# Patient Record
Sex: Male | Born: 1965 | Race: White | Hispanic: No | Marital: Married | Smoking: Current every day smoker
Health system: Southern US, Community
[De-identification: ages and names within clinical notes are randomized; demographics above are authoritative.]

## PROBLEM LIST (undated history)

## (undated) DIAGNOSIS — K219 Gastro-esophageal reflux disease without esophagitis: Secondary | ICD-10-CM

## (undated) DIAGNOSIS — IMO0002 Reserved for concepts with insufficient information to code with codable children: Secondary | ICD-10-CM

## (undated) DIAGNOSIS — R519 Headache, unspecified: Secondary | ICD-10-CM

## (undated) DIAGNOSIS — M539 Dorsopathy, unspecified: Secondary | ICD-10-CM

## (undated) DIAGNOSIS — R51 Headache: Secondary | ICD-10-CM

## (undated) DIAGNOSIS — M1990 Unspecified osteoarthritis, unspecified site: Secondary | ICD-10-CM

## (undated) DIAGNOSIS — R001 Bradycardia, unspecified: Secondary | ICD-10-CM

## (undated) DIAGNOSIS — F32A Depression, unspecified: Secondary | ICD-10-CM

## (undated) DIAGNOSIS — I639 Cerebral infarction, unspecified: Secondary | ICD-10-CM

## (undated) DIAGNOSIS — F329 Major depressive disorder, single episode, unspecified: Secondary | ICD-10-CM

## (undated) HISTORY — PX: SHOULDER SURGERY: SHX246

## (undated) HISTORY — DX: Unspecified osteoarthritis, unspecified site: M19.90

## (undated) HISTORY — DX: Bradycardia, unspecified: R00.1

## (undated) HISTORY — DX: Reserved for concepts with insufficient information to code with codable children: IMO0002

## (undated) HISTORY — DX: Dorsopathy, unspecified: M53.9

---

## 1999-06-30 ENCOUNTER — Encounter: Payer: Self-pay | Admitting: Emergency Medicine

## 1999-06-30 ENCOUNTER — Emergency Department (HOSPITAL_COMMUNITY): Admission: EM | Admit: 1999-06-30 | Discharge: 1999-06-30 | Payer: Self-pay | Admitting: Emergency Medicine

## 2001-07-06 ENCOUNTER — Ambulatory Visit (HOSPITAL_COMMUNITY): Admission: RE | Admit: 2001-07-06 | Discharge: 2001-07-06 | Payer: Self-pay | Admitting: Family Medicine

## 2001-07-06 ENCOUNTER — Encounter: Payer: Self-pay | Admitting: Family Medicine

## 2001-07-08 ENCOUNTER — Ambulatory Visit (HOSPITAL_COMMUNITY): Admission: RE | Admit: 2001-07-08 | Discharge: 2001-07-08 | Payer: Self-pay | Admitting: Family Medicine

## 2001-07-08 ENCOUNTER — Encounter: Payer: Self-pay | Admitting: Family Medicine

## 2001-11-10 ENCOUNTER — Emergency Department (HOSPITAL_COMMUNITY): Admission: EM | Admit: 2001-11-10 | Discharge: 2001-11-10 | Payer: Self-pay | Admitting: Emergency Medicine

## 2001-11-10 ENCOUNTER — Encounter: Payer: Self-pay | Admitting: Emergency Medicine

## 2001-11-15 ENCOUNTER — Encounter: Payer: Self-pay | Admitting: Emergency Medicine

## 2001-11-15 ENCOUNTER — Emergency Department (HOSPITAL_COMMUNITY): Admission: EM | Admit: 2001-11-15 | Discharge: 2001-11-15 | Payer: Self-pay | Admitting: Emergency Medicine

## 2005-04-15 ENCOUNTER — Encounter: Admission: RE | Admit: 2005-04-15 | Discharge: 2005-04-15 | Payer: Self-pay | Admitting: Family Medicine

## 2008-01-28 ENCOUNTER — Emergency Department (HOSPITAL_BASED_OUTPATIENT_CLINIC_OR_DEPARTMENT_OTHER): Admission: EM | Admit: 2008-01-28 | Discharge: 2008-01-28 | Payer: Self-pay | Admitting: Emergency Medicine

## 2008-02-18 ENCOUNTER — Encounter: Admission: RE | Admit: 2008-02-18 | Discharge: 2008-02-18 | Payer: Self-pay | Admitting: Orthopedic Surgery

## 2008-02-25 ENCOUNTER — Encounter: Admission: RE | Admit: 2008-02-25 | Discharge: 2008-02-25 | Payer: Self-pay | Admitting: Orthopedic Surgery

## 2010-06-23 ENCOUNTER — Encounter: Payer: Self-pay | Admitting: Orthopedic Surgery

## 2010-07-26 ENCOUNTER — Ambulatory Visit (INDEPENDENT_AMBULATORY_CARE_PROVIDER_SITE_OTHER): Payer: BC Managed Care – PPO | Admitting: Emergency Medicine

## 2010-07-26 ENCOUNTER — Encounter: Payer: Self-pay | Admitting: Emergency Medicine

## 2010-07-26 DIAGNOSIS — S139XXA Sprain of joints and ligaments of unspecified parts of neck, initial encounter: Secondary | ICD-10-CM | POA: Insufficient documentation

## 2010-07-26 DIAGNOSIS — M542 Cervicalgia: Secondary | ICD-10-CM

## 2010-07-28 ENCOUNTER — Telehealth (INDEPENDENT_AMBULATORY_CARE_PROVIDER_SITE_OTHER): Payer: Self-pay | Admitting: *Deleted

## 2010-07-29 NOTE — Assessment & Plan Note (Signed)
Summary: PROBLEMS WITH SHOULDER AND NECK/WSE(rm2)   Vital Signs:  Patient Profile:   45 Years Old Male CC:      shoulder/nek pain Height:     70 inches Weight:      170.5 pounds O2 Sat:      99 % O2 treatment:    Room Air Temp:     98.5 degrees F oral Pulse rate:   57 / minute Resp:     20 per minute BP sitting:   117 / 71  (left arm) Cuff size:   regular  Vitals Entered By: Burnard Hawthorne RN (July 26, 2010 10:45 AM)                  Updated Prior Medication List: No Medications Current Allergies: No known allergies History of Present Illness Chief Complaint: shoulder/nek pain History of Present Illness: Was lifting and installing a double door about 3 weeks ago.  No trauma or falling.  Later that night and the next day was very sore.  Hasn't used any meds or heat.  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  Pain located L side of neck radiating into neck and shoulder.  No weakness, numbness.  Pain is cramping and fairly constant, worse with movement.  REVIEW OF SYSTEMS Constitutional Symptoms      Denies fever, chills, night sweats, weight loss, weight gain, and fatigue.  Eyes       Denies change in vision, eye pain, eye discharge, glasses, contact lenses, and eye surgery. Ear/Nose/Throat/Mouth       Denies hearing loss/aids, change in hearing, ear pain, ear discharge, dizziness, frequent runny nose, frequent nose bleeds, sinus problems, sore throat, hoarseness, and tooth pain or bleeding.  Respiratory       Denies dry cough, productive cough, wheezing, shortness of breath, asthma, bronchitis, and emphysema/COPD.  Cardiovascular       Denies murmurs, chest pain, and tires easily with exhertion.    Gastrointestinal       Denies stomach pain, nausea/vomiting, diarrhea, constipation, blood in bowel movements, and indigestion. Genitourniary       Denies painful urination, kidney stones, and loss of urinary  control. Neurological       Denies paralysis, seizures, and fainting/blackouts. Musculoskeletal       Complains of muscle pain, joint pain, and decreased range of motion.      Denies joint stiffness, redness, swelling, muscle weakness, and gout.  Skin       Denies bruising, unusual mles/lumps or sores, and hair/skin or nail changes.  Psych       Denies mood changes, temper/anger issues, anxiety/stress, speech problems, depression, and sleep problems. Other Comments: lifting a door x2weeks ago have been having pain in neck/shoulder and moving dowm left arm.   Past History:  Past Medical History: Unremarkable  Past Surgical History: Rotator cuff repair  Family History: leukemia  Social History: Reviewed history and no changes required. smoker- pack/day drinks alcohol no street drugs Physical Exam General appearance: well developed, well nourished, mild distress and holding neck slightly cocked the side. Head: normocephalic, atraumatic MSE: oriented to time, place, and person Neck ROM is full but slow due to pain.  +TTP and spasm L paraspinal.  No central bony tenderness.  Spurling test is negative. Shoulder FROM but mild tenderness acromion and bicep tendon.  Hawking and Neers neg.  No swelling or bruising. Rest of his back exam and neck exam are  normal Assessment New Problems: NECK SPASM (ICD-847.0) NECK PAIN (ICD-723.1)   Plan New Medications/Changes: ULTRACET 37.5-325 MG TABS (TRAMADOL-ACETAMINOPHEN) 1 by mouth q8 hrs as needed pain  #20 x 0, 07/26/2010, Hoyt Koch MD FLEXERIL 10 MG TABS (CYCLOBENZAPRINE HCL) 1 by mouth up to three times a day as needed for spasms  #21 x 0, 07/26/2010, Hoyt Koch MD PREDNISONE 10 MG TABS (PREDNISONE) 20mg  two times a day for 5 days  #QS x 0, 07/26/2010, Hoyt Koch MD  New Orders: New Patient Level III [phone] Planning Comments:   Heating pad Take prednisone first, then switch to ultracet.  Flexeril caution with  sedation.  Will call patient in a few days.  If not improving, send to sports med and/or PT.  At that time, consider cervical Xray   The patient and/or caregiver has been counseled thoroughly with regard to medications prescribed including dosage, schedule, interactions, rationale for use, and possible side effects and they verbalize understanding.  Diagnoses and expected course of recovery discussed and will return if not improved as expected or if the condition worsens. Patient and/or caregiver verbalized understanding.  Prescriptions: ULTRACET 37.5-325 MG TABS (TRAMADOL-ACETAMINOPHEN) 1 by mouth q8 hrs as needed pain  #20 x 0   Entered and Authorized by:   Hoyt Koch MD   Signed by:   Hoyt Koch MD on 07/26/2010   Method used:   Print then Give to Patient   RxID:   [id number] FLEXERIL 10 MG TABS (CYCLOBENZAPRINE HCL) 1 by mouth up to three times a day as needed for spasms  #21 x 0   Entered and Authorized by:   Hoyt Koch MD   Signed by:   Hoyt Koch MD on 07/26/2010   Method used:   Print then Give to Patient   RxID:   [id number] PREDNISONE 10 MG TABS (PREDNISONE) 20mg  two times a day for 5 days  #QS x 0   Entered and Authorized by:   Hoyt Koch MD   Signed by:   Hoyt Koch MD on 07/26/2010   Method used:   Print then Give to Patient   RxID:   [id number]   Orders Added: 1)  New Patient Level III [78588]

## 2010-08-07 NOTE — Progress Notes (Signed)
  Phone Note Outgoing Call Call back at [facility] Phone [phone]   Call placed by: Lajean Saver RN,  July 28, 2010 2:19 PM Call placed to: Patient Summary of Call: Callback: No answer. Message left with reason for call. Call back if no better and will refer to sports med and/or PT

## 2011-01-12 DIAGNOSIS — M549 Dorsalgia, unspecified: Secondary | ICD-10-CM | POA: Insufficient documentation

## 2011-10-07 ENCOUNTER — Encounter: Payer: Self-pay | Admitting: *Deleted

## 2011-10-07 DIAGNOSIS — M539 Dorsopathy, unspecified: Secondary | ICD-10-CM | POA: Insufficient documentation

## 2011-10-07 DIAGNOSIS — R079 Chest pain, unspecified: Secondary | ICD-10-CM | POA: Insufficient documentation

## 2011-10-07 DIAGNOSIS — R001 Bradycardia, unspecified: Secondary | ICD-10-CM | POA: Insufficient documentation

## 2011-10-07 DIAGNOSIS — IMO0002 Reserved for concepts with insufficient information to code with codable children: Secondary | ICD-10-CM | POA: Insufficient documentation

## 2013-09-28 DIAGNOSIS — M5137 Other intervertebral disc degeneration, lumbosacral region: Secondary | ICD-10-CM | POA: Insufficient documentation

## 2013-09-28 DIAGNOSIS — Z720 Tobacco use: Secondary | ICD-10-CM | POA: Insufficient documentation

## 2013-09-28 DIAGNOSIS — M5410 Radiculopathy, site unspecified: Secondary | ICD-10-CM | POA: Insufficient documentation

## 2013-09-28 DIAGNOSIS — D179 Benign lipomatous neoplasm, unspecified: Secondary | ICD-10-CM | POA: Insufficient documentation

## 2013-09-28 DIAGNOSIS — M51379 Other intervertebral disc degeneration, lumbosacral region without mention of lumbar back pain or lower extremity pain: Secondary | ICD-10-CM | POA: Insufficient documentation

## 2013-09-28 DIAGNOSIS — R001 Bradycardia, unspecified: Secondary | ICD-10-CM | POA: Insufficient documentation

## 2013-09-28 DIAGNOSIS — Z23 Encounter for immunization: Secondary | ICD-10-CM | POA: Insufficient documentation

## 2013-09-28 DIAGNOSIS — D171 Benign lipomatous neoplasm of skin and subcutaneous tissue of trunk: Secondary | ICD-10-CM | POA: Insufficient documentation

## 2014-03-01 DIAGNOSIS — I639 Cerebral infarction, unspecified: Secondary | ICD-10-CM

## 2014-03-01 HISTORY — DX: Cerebral infarction, unspecified: I63.9

## 2014-03-13 DIAGNOSIS — I6782 Cerebral ischemia: Secondary | ICD-10-CM | POA: Insufficient documentation

## 2014-03-13 DIAGNOSIS — G939 Disorder of brain, unspecified: Secondary | ICD-10-CM | POA: Insufficient documentation

## 2014-03-15 DIAGNOSIS — Z9889 Other specified postprocedural states: Secondary | ICD-10-CM | POA: Insufficient documentation

## 2014-03-18 DIAGNOSIS — I6529 Occlusion and stenosis of unspecified carotid artery: Secondary | ICD-10-CM | POA: Insufficient documentation

## 2014-03-19 DIAGNOSIS — R9431 Abnormal electrocardiogram [ECG] [EKG]: Secondary | ICD-10-CM | POA: Insufficient documentation

## 2014-03-19 DIAGNOSIS — R001 Bradycardia, unspecified: Secondary | ICD-10-CM | POA: Insufficient documentation

## 2014-04-10 DIAGNOSIS — Z8673 Personal history of transient ischemic attack (TIA), and cerebral infarction without residual deficits: Secondary | ICD-10-CM | POA: Insufficient documentation

## 2014-05-08 DIAGNOSIS — R519 Headache, unspecified: Secondary | ICD-10-CM | POA: Insufficient documentation

## 2014-05-08 DIAGNOSIS — R51 Headache: Secondary | ICD-10-CM

## 2014-05-08 DIAGNOSIS — E785 Hyperlipidemia, unspecified: Secondary | ICD-10-CM | POA: Insufficient documentation

## 2014-07-11 ENCOUNTER — Other Ambulatory Visit (HOSPITAL_COMMUNITY): Payer: Self-pay | Admitting: Neurosurgery

## 2014-07-18 ENCOUNTER — Other Ambulatory Visit (HOSPITAL_COMMUNITY): Payer: Self-pay

## 2014-07-18 NOTE — Pre-Procedure Instructions (Signed)
Cody Knight  07/18/2014   Your procedure is scheduled on:  08/02/14  Report to [facility] Admitting at 530 AM.  Call this number if you have problems the morning of surgery: [phone]   Remember:   Do not eat food or drink liquids after midnight.   Take these medicines the morning of surgery with A SIP OF WATER: neurontin,,oxycodone,protonix,paxil   Do not wear jewelry, make-up or nail polish.  Do not wear lotions, powders, or perfumes. You may wear deodorant.  Do not shave 48 hours prior to surgery. Men may shave face and neck.  Do not bring valuables to the hospital.  [facility] is not responsible                  for any belongings or valuables.               Contacts, dentures or bridgework may not be worn into surgery.  Leave suitcase in the car. After surgery it may be brought to your room.  For patients admitted to the hospital, discharge time is determined by your                treatment team.               Patients discharged the day of surgery will not be allowed to drive  home.  Name and phone number of your driver: family  Special Instructions: Shower using CHG 2 nights before surgery and the night before surgery.  If you shower the day of surgery use CHG.  Use special wash - you have one bottle of CHG for all showers.  You should use approximately 1/3 of the bottle for each shower.   Please read over the following fact sheets that you were given: Pain Booklet, Coughing and Deep Breathing, MRSA Information and Surgical Site Infection Prevention

## 2014-07-19 ENCOUNTER — Encounter (HOSPITAL_COMMUNITY)
Admission: RE | Admit: 2014-07-19 | Discharge: 2014-07-19 | Disposition: A | Discharge status: Home / Self Care | Payer: BLUE CROSS/BLUE SHIELD | Source: Ambulatory Visit | Attending: Neurosurgery | Admitting: Neurosurgery

## 2014-07-19 ENCOUNTER — Encounter (HOSPITAL_COMMUNITY): Payer: Self-pay

## 2014-07-19 DIAGNOSIS — M4722 Other spondylosis with radiculopathy, cervical region: Secondary | ICD-10-CM | POA: Diagnosis not present

## 2014-07-19 DIAGNOSIS — Z01810 Encounter for preprocedural cardiovascular examination: Secondary | ICD-10-CM | POA: Diagnosis not present

## 2014-07-19 DIAGNOSIS — Z01812 Encounter for preprocedural laboratory examination: Secondary | ICD-10-CM | POA: Diagnosis present

## 2014-07-19 HISTORY — DX: Headache: R51

## 2014-07-19 HISTORY — DX: Major depressive disorder, single episode, unspecified: F32.9

## 2014-07-19 HISTORY — DX: Depression, unspecified: F32.A

## 2014-07-19 HISTORY — DX: Headache, unspecified: R51.9

## 2014-07-19 HISTORY — DX: Cerebral infarction, unspecified: I63.9

## 2014-07-19 LAB — CBC
HCT: 46.8 % (ref 39.0–52.0)
Hemoglobin: 16 g/dL (ref 13.0–17.0)
MCH: 31.7 pg (ref 26.0–34.0)
MCHC: 34.2 g/dL (ref 30.0–36.0)
MCV: 92.7 fL (ref 78.0–100.0)
Platelets: 236 10*3/uL (ref 150–400)
RBC: 5.05 MIL/uL (ref 4.22–5.81)
RDW: 13.4 % (ref 11.5–15.5)
WBC: 8.3 10*3/uL (ref 4.0–10.5)

## 2014-07-19 LAB — BASIC METABOLIC PANEL
Anion gap: 7 (ref 5–15)
BUN: 17 mg/dL (ref 6–23)
CO2: 26 mmol/L (ref 19–32)
CREATININE: 0.92 mg/dL (ref 0.50–1.35)
Calcium: 9.4 mg/dL (ref 8.4–10.5)
Chloride: 108 mmol/L (ref 96–112)
GFR calc Af Amer: >90 mL/min (ref >90)
GFR calc non Af Amer: >90 mL/min (ref >90)
Glucose, Bld: 109 mg/dL — ABNORMAL HIGH (ref 70–99)
Potassium: 3.8 mmol/L (ref 3.5–5.1)
Sodium: 141 mmol/L (ref 135–145)

## 2014-07-19 LAB — SURGICAL PCR SCREEN
MRSA, PCR: NEGATIVE
STAPHYLOCOCCUS AUREUS: NEGATIVE

## 2014-08-01 MED ORDER — CEFAZOLIN SODIUM-DEXTROSE 2-3 GM-% IV SOLR
2.0000 g | INTRAVENOUS | Status: DC
  Filled 2014-08-01: qty 50

## 2014-08-02 ENCOUNTER — Encounter (HOSPITAL_COMMUNITY)
Admission: RE | Disposition: A | Discharge status: Home / Self Care | Payer: Self-pay | Source: Ambulatory Visit | Attending: Neurosurgery

## 2014-08-02 ENCOUNTER — Ambulatory Visit (HOSPITAL_COMMUNITY): Payer: BLUE CROSS/BLUE SHIELD | Admitting: Anesthesiology

## 2014-08-02 ENCOUNTER — Encounter (HOSPITAL_COMMUNITY): Payer: Self-pay | Admitting: *Deleted

## 2014-08-02 ENCOUNTER — Ambulatory Visit (HOSPITAL_COMMUNITY): Payer: BLUE CROSS/BLUE SHIELD

## 2014-08-02 ENCOUNTER — Ambulatory Visit (HOSPITAL_COMMUNITY)
Admission: RE | Admit: 2014-08-02 | Discharge: 2014-08-03 | Disposition: A | Discharge status: Home / Self Care | Payer: BLUE CROSS/BLUE SHIELD | Source: Ambulatory Visit | Attending: Neurosurgery | Admitting: Neurosurgery

## 2014-08-02 DIAGNOSIS — F329 Major depressive disorder, single episode, unspecified: Secondary | ICD-10-CM | POA: Insufficient documentation

## 2014-08-02 DIAGNOSIS — Z7982 Long term (current) use of aspirin: Secondary | ICD-10-CM | POA: Diagnosis not present

## 2014-08-02 DIAGNOSIS — M4722 Other spondylosis with radiculopathy, cervical region: Secondary | ICD-10-CM | POA: Diagnosis not present

## 2014-08-02 DIAGNOSIS — Z419 Encounter for procedure for purposes other than remedying health state, unspecified: Secondary | ICD-10-CM

## 2014-08-02 DIAGNOSIS — Z79899 Other long term (current) drug therapy: Secondary | ICD-10-CM | POA: Insufficient documentation

## 2014-08-02 DIAGNOSIS — M47812 Spondylosis without myelopathy or radiculopathy, cervical region: Secondary | ICD-10-CM | POA: Insufficient documentation

## 2014-08-02 DIAGNOSIS — F17210 Nicotine dependence, cigarettes, uncomplicated: Secondary | ICD-10-CM | POA: Diagnosis not present

## 2014-08-02 DIAGNOSIS — M79601 Pain in right arm: Secondary | ICD-10-CM | POA: Diagnosis present

## 2014-08-02 DIAGNOSIS — Z8673 Personal history of transient ischemic attack (TIA), and cerebral infarction without residual deficits: Secondary | ICD-10-CM | POA: Insufficient documentation

## 2014-08-02 HISTORY — PX: ANTERIOR CERVICAL DECOMP/DISCECTOMY FUSION: SHX1161

## 2014-08-02 SURGERY — ANTERIOR CERVICAL DECOMPRESSION/DISCECTOMY FUSION 1 LEVEL
Anesthesia: General | Site: Spine Cervical

## 2014-08-02 MED ORDER — SUCCINYLCHOLINE CHLORIDE 20 MG/ML IJ SOLN
INTRAMUSCULAR | Status: AC
  Filled 2014-08-02: qty 1

## 2014-08-02 MED ORDER — ACETAMINOPHEN 325 MG PO TABS: 650.0000 mg | ORAL_TABLET | ORAL | Status: DC | PRN

## 2014-08-02 MED ORDER — PHENOL 1.4 % MT LIQD
1.0000 | OROMUCOSAL | Status: DC | PRN
  Filled 2014-08-02: qty 177

## 2014-08-02 MED ORDER — MENTHOL 3 MG MT LOZG: 1.0000 | LOZENGE | OROMUCOSAL | Status: DC | PRN

## 2014-08-02 MED ORDER — OXYCODONE HCL 5 MG/5ML PO SOLN: 5.0000 mg | Freq: Once | ORAL | Status: AC | PRN

## 2014-08-02 MED ORDER — FENTANYL CITRATE 0.05 MG/ML IJ SOLN
INTRAMUSCULAR | Status: AC
  Filled 2014-08-02: qty 5

## 2014-08-02 MED ORDER — NEOSTIGMINE METHYLSULFATE 10 MG/10ML IV SOLN
INTRAVENOUS | Status: DC | PRN
  Administered 2014-08-02: 3 mg via INTRAVENOUS

## 2014-08-02 MED ORDER — ALUM & MAG HYDROXIDE-SIMETH 200-200-20 MG/5ML PO SUSP: 30.0000 mL | Freq: Four times a day (QID) | ORAL | Status: DC | PRN

## 2014-08-02 MED ORDER — SODIUM CHLORIDE 0.9 % IR SOLN
Status: DC | PRN
  Administered 2014-08-02: 500 mL

## 2014-08-02 MED ORDER — DIAZEPAM 5 MG PO TABS
ORAL_TABLET | ORAL | Status: AC
  Administered 2014-08-02: 5 mg via ORAL
  Filled 2014-08-02: qty 1

## 2014-08-02 MED ORDER — LIDOCAINE HCL (CARDIAC) 20 MG/ML IV SOLN
INTRAVENOUS | Status: DC | PRN
  Administered 2014-08-02: 50 mg via INTRAVENOUS

## 2014-08-02 MED ORDER — HYDROMORPHONE HCL 1 MG/ML IJ SOLN
INTRAMUSCULAR | Status: AC
  Administered 2014-08-02: 0.5 mg via INTRAVENOUS
  Filled 2014-08-02: qty 2

## 2014-08-02 MED ORDER — OXYCODONE HCL 5 MG PO TABS
5.0000 mg | ORAL_TABLET | Freq: Once | ORAL | Status: AC | PRN
  Administered 2014-08-02: 5 mg via ORAL

## 2014-08-02 MED ORDER — GLYCOPYRROLATE 0.2 MG/ML IJ SOLN
INTRAMUSCULAR | Status: AC
  Filled 2014-08-02: qty 2

## 2014-08-02 MED ORDER — NEOSTIGMINE METHYLSULFATE 10 MG/10ML IV SOLN
INTRAVENOUS | Status: AC
  Filled 2014-08-02: qty 1

## 2014-08-02 MED ORDER — OXYCODONE-ACETAMINOPHEN 5-325 MG PO TABS
ORAL_TABLET | ORAL | Status: AC
  Administered 2014-08-02: 2 via ORAL
  Filled 2014-08-02: qty 2

## 2014-08-02 MED ORDER — LACTATED RINGERS IV SOLN
INTRAVENOUS | Status: DC | PRN
  Administered 2014-08-02 (×2): via INTRAVENOUS

## 2014-08-02 MED ORDER — ONDANSETRON HCL 4 MG/2ML IJ SOLN: 4.0000 mg | Freq: Four times a day (QID) | INTRAMUSCULAR | Status: DC | PRN

## 2014-08-02 MED ORDER — GLYCOPYRROLATE 0.2 MG/ML IJ SOLN
INTRAMUSCULAR | Status: DC | PRN
  Administered 2014-08-02: 0.4 mg via INTRAVENOUS

## 2014-08-02 MED ORDER — SODIUM CHLORIDE 0.9 % IJ SOLN
INTRAMUSCULAR | Status: AC
  Filled 2014-08-02: qty 10

## 2014-08-02 MED ORDER — EPHEDRINE SULFATE 50 MG/ML IJ SOLN
INTRAMUSCULAR | Status: AC
  Filled 2014-08-02: qty 1

## 2014-08-02 MED ORDER — OXYCODONE HCL 5 MG PO TABS
ORAL_TABLET | ORAL | Status: AC
  Filled 2014-08-02: qty 1

## 2014-08-02 MED ORDER — ONDANSETRON HCL 4 MG/2ML IJ SOLN
INTRAMUSCULAR | Status: DC | PRN
  Administered 2014-08-02: 4 mg via INTRAVENOUS

## 2014-08-02 MED ORDER — NICOTINE 21 MG/24HR TD PT24
21.0000 mg | MEDICATED_PATCH | Freq: Every day | TRANSDERMAL | Status: DC
  Administered 2014-08-02: 21 mg via TRANSDERMAL
  Filled 2014-08-02 (×2): qty 1

## 2014-08-02 MED ORDER — ROCURONIUM BROMIDE 50 MG/5ML IV SOLN
INTRAVENOUS | Status: AC
  Filled 2014-08-02: qty 1

## 2014-08-02 MED ORDER — MORPHINE SULFATE 2 MG/ML IJ SOLN
1.0000 mg | INTRAMUSCULAR | Status: DC | PRN
Start: 2014-08-02 — End: 2014-08-03
  Administered 2014-08-02 (×2): 4 mg via INTRAVENOUS
  Administered 2014-08-03: 2 mg via INTRAVENOUS
  Filled 2014-08-02: qty 1
  Filled 2014-08-02 (×2): qty 2

## 2014-08-02 MED ORDER — FENTANYL CITRATE 0.05 MG/ML IJ SOLN
INTRAMUSCULAR | Status: DC | PRN
Start: 2014-08-02 — End: 2014-08-02
  Administered 2014-08-02: 50 ug via INTRAVENOUS
  Administered 2014-08-02: 150 ug via INTRAVENOUS
  Administered 2014-08-02 (×3): 100 ug via INTRAVENOUS

## 2014-08-02 MED ORDER — 0.9 % SODIUM CHLORIDE (POUR BTL) OPTIME
TOPICAL | Status: DC | PRN
  Administered 2014-08-02: 1000 mL

## 2014-08-02 MED ORDER — PANTOPRAZOLE SODIUM 40 MG PO TBEC
40.0000 mg | DELAYED_RELEASE_TABLET | Freq: Every day | ORAL | Status: DC
  Administered 2014-08-02: 40 mg via ORAL
  Filled 2014-08-02: qty 1

## 2014-08-02 MED ORDER — ROSUVASTATIN CALCIUM 20 MG PO TABS
20.0000 mg | ORAL_TABLET | Freq: Every day | ORAL | Status: DC
  Administered 2014-08-02: 20 mg via ORAL
  Filled 2014-08-02 (×2): qty 1

## 2014-08-02 MED ORDER — DOCUSATE SODIUM 100 MG PO CAPS
100.0000 mg | ORAL_CAPSULE | Freq: Two times a day (BID) | ORAL | Status: DC
  Administered 2014-08-02 (×2): 100 mg via ORAL
  Filled 2014-08-02 (×2): qty 1

## 2014-08-02 MED ORDER — PROPOFOL 10 MG/ML IV BOLUS
INTRAVENOUS | Status: DC | PRN
  Administered 2014-08-02: 170 mg via INTRAVENOUS

## 2014-08-02 MED ORDER — BUPIVACAINE-EPINEPHRINE (PF) 0.5% -1:200000 IJ SOLN
INTRAMUSCULAR | Status: DC | PRN
  Administered 2014-08-02: 10 mL

## 2014-08-02 MED ORDER — LACTATED RINGERS IV SOLN: INTRAVENOUS | Status: DC

## 2014-08-02 MED ORDER — DIAZEPAM 5 MG PO TABS
5.0000 mg | ORAL_TABLET | Freq: Four times a day (QID) | ORAL | Status: DC | PRN
  Administered 2014-08-02 – 2014-08-03 (×3): 5 mg via ORAL
  Filled 2014-08-02 (×3): qty 1

## 2014-08-02 MED ORDER — MIDAZOLAM HCL 5 MG/5ML IJ SOLN
INTRAMUSCULAR | Status: DC | PRN
  Administered 2014-08-02: 2 mg via INTRAVENOUS

## 2014-08-02 MED ORDER — LIDOCAINE HCL (CARDIAC) 20 MG/ML IV SOLN
INTRAVENOUS | Status: AC
  Filled 2014-08-02: qty 5

## 2014-08-02 MED ORDER — OXYCODONE-ACETAMINOPHEN 5-325 MG PO TABS
1.0000 | ORAL_TABLET | ORAL | Status: DC | PRN
  Administered 2014-08-02 – 2014-08-03 (×5): 2 via ORAL
  Filled 2014-08-02 (×5): qty 2

## 2014-08-02 MED ORDER — DEXAMETHASONE SODIUM PHOSPHATE 4 MG/ML IJ SOLN: 4.0000 mg | Freq: Four times a day (QID) | INTRAMUSCULAR | Status: AC

## 2014-08-02 MED ORDER — BACITRACIN ZINC 500 UNIT/GM EX OINT
TOPICAL_OINTMENT | CUTANEOUS | Status: DC | PRN
  Administered 2014-08-02: 1 via TOPICAL

## 2014-08-02 MED ORDER — DEXAMETHASONE 4 MG PO TABS
4.0000 mg | ORAL_TABLET | Freq: Four times a day (QID) | ORAL | Status: AC
  Administered 2014-08-02 (×3): 4 mg via ORAL
  Filled 2014-08-02 (×3): qty 1

## 2014-08-02 MED ORDER — GABAPENTIN 300 MG PO CAPS
300.0000 mg | ORAL_CAPSULE | Freq: Two times a day (BID) | ORAL | Status: DC
  Administered 2014-08-02 (×2): 300 mg via ORAL
  Filled 2014-08-02 (×4): qty 1

## 2014-08-02 MED ORDER — CEFAZOLIN SODIUM-DEXTROSE 2-3 GM-% IV SOLR
2.0000 g | Freq: Three times a day (TID) | INTRAVENOUS | Status: AC
  Filled 2014-08-02 (×2): qty 50

## 2014-08-02 MED ORDER — LIDOCAINE HCL 4 % MT SOLN
OROMUCOSAL | Status: DC | PRN
  Administered 2014-08-02: 4 mL via TOPICAL

## 2014-08-02 MED ORDER — ONDANSETRON HCL 4 MG/2ML IJ SOLN: 4.0000 mg | INTRAMUSCULAR | Status: DC | PRN

## 2014-08-02 MED ORDER — HYDROMORPHONE HCL 1 MG/ML IJ SOLN
0.2500 mg | INTRAMUSCULAR | Status: DC | PRN
  Administered 2014-08-02 (×4): 0.5 mg via INTRAVENOUS

## 2014-08-02 MED ORDER — ROCURONIUM BROMIDE 100 MG/10ML IV SOLN
INTRAVENOUS | Status: DC | PRN
  Administered 2014-08-02: 50 mg via INTRAVENOUS

## 2014-08-02 MED ORDER — HEMOSTATIC AGENTS (NO CHARGE) OPTIME
TOPICAL | Status: DC | PRN
  Administered 2014-08-02: 1 via TOPICAL

## 2014-08-02 MED ORDER — PAROXETINE HCL 20 MG PO TABS
20.0000 mg | ORAL_TABLET | Freq: Every day | ORAL | Status: DC
Start: 2014-08-03 — End: 2014-08-03
  Filled 2014-08-02: qty 1

## 2014-08-02 MED ORDER — PROPOFOL 10 MG/ML IV BOLUS
INTRAVENOUS | Status: AC
  Filled 2014-08-02: qty 20

## 2014-08-02 MED ORDER — THROMBIN 5000 UNITS EX SOLR
CUTANEOUS | Status: DC | PRN
  Administered 2014-08-02 (×2): 5000 [IU] via TOPICAL

## 2014-08-02 MED ORDER — MORPHINE SULFATE 4 MG/ML IJ SOLN
INTRAMUSCULAR | Status: AC
  Administered 2014-08-02: 4 mg
  Filled 2014-08-02: qty 1

## 2014-08-02 MED ORDER — ACETAMINOPHEN 650 MG RE SUPP: 650.0000 mg | RECTAL | Status: DC | PRN

## 2014-08-02 MED ORDER — MIDAZOLAM HCL 2 MG/2ML IJ SOLN
INTRAMUSCULAR | Status: AC
  Filled 2014-08-02: qty 2

## 2014-08-02 MED ORDER — HYDROCODONE-ACETAMINOPHEN 5-325 MG PO TABS
1.0000 | ORAL_TABLET | ORAL | Status: DC | PRN
Start: 2014-08-02 — End: 2014-08-03

## 2014-08-02 SURGICAL SUPPLY — 65 items
BAG DECANTER FOR FLEXI CONT (MISCELLANEOUS) ×2 IMPLANT
BENZOIN TINCTURE PRP APPL 2/3 (GAUZE/BANDAGES/DRESSINGS) ×2 IMPLANT
BIT DRILL NEURO 2X3.1 SFT TUCH (MISCELLANEOUS) ×1 IMPLANT
BLADE SURG 15 STRL LF DISP TIS (BLADE) IMPLANT
BLADE SURG 15 STRL SS (BLADE)
BLADE ULTRA TIP 2M (BLADE) ×2 IMPLANT
BRUSH SCRUB EZ PLAIN DRY (MISCELLANEOUS) ×2 IMPLANT
BUR BARREL STRAIGHT FLUTE 4.0 (BURR) ×2 IMPLANT
BUR MATCHSTICK NEURO 3.0 LAGG (BURR) ×2 IMPLANT
CANISTER SUCT 3000ML PPV (MISCELLANEOUS) ×2 IMPLANT
CONT SPEC 4OZ CLIKSEAL STRL BL (MISCELLANEOUS) ×2 IMPLANT
COVER MAYO STAND STRL (DRAPES) ×2 IMPLANT
DEVICE FUSION VIST S 14X14X6MM (Trauma) ×1 IMPLANT
DRAPE LAPAROTOMY 100X72 PEDS (DRAPES) ×2 IMPLANT
DRAPE MICROSCOPE LEICA (MISCELLANEOUS) ×2 IMPLANT
DRAPE POUCH INSTRU U-SHP 10X18 (DRAPES) ×2 IMPLANT
DRAPE SURG 17X23 STRL (DRAPES) ×4 IMPLANT
DRILL NEURO 2X3.1 SOFT TOUCH (MISCELLANEOUS) ×2
ELECT REM PT RETURN 9FT ADLT (ELECTROSURGICAL) ×2
ELECTRODE REM PT RTRN 9FT ADLT (ELECTROSURGICAL) ×1 IMPLANT
GAUZE SPONGE 4X4 12PLY STRL (GAUZE/BANDAGES/DRESSINGS) ×2 IMPLANT
GAUZE SPONGE 4X4 16PLY XRAY LF (GAUZE/BANDAGES/DRESSINGS) ×2 IMPLANT
GLOVE BIO SURGEON STRL SZ7 (GLOVE) ×2 IMPLANT
GLOVE BIO SURGEON STRL SZ8.5 (GLOVE) ×2 IMPLANT
GLOVE BIOGEL M 8.0 STRL (GLOVE) ×2 IMPLANT
GLOVE BIOGEL PI IND STRL 7.0 (GLOVE) ×2 IMPLANT
GLOVE BIOGEL PI IND STRL 7.5 (GLOVE) ×1 IMPLANT
GLOVE BIOGEL PI INDICATOR 7.0 (GLOVE) ×2
GLOVE BIOGEL PI INDICATOR 7.5 (GLOVE) ×1
GLOVE ECLIPSE 6.5 STRL STRAW (GLOVE) ×6 IMPLANT
GLOVE EXAM NITRILE LRG STRL (GLOVE) IMPLANT
GLOVE EXAM NITRILE MD LF STRL (GLOVE) IMPLANT
GLOVE EXAM NITRILE XL STR (GLOVE) IMPLANT
GLOVE EXAM NITRILE XS STR PU (GLOVE) IMPLANT
GLOVE SS BIOGEL STRL SZ 8 (GLOVE) ×2 IMPLANT
GLOVE SUPERSENSE BIOGEL SZ 8 (GLOVE) ×2
GLOVE SURG SS PI 7.5 STRL IVOR (GLOVE) ×4 IMPLANT
GOWN STRL REUS W/ TWL LRG LVL3 (GOWN DISPOSABLE) ×2 IMPLANT
GOWN STRL REUS W/ TWL XL LVL3 (GOWN DISPOSABLE) ×2 IMPLANT
GOWN STRL REUS W/TWL LRG LVL3 (GOWN DISPOSABLE) ×2
GOWN STRL REUS W/TWL XL LVL3 (GOWN DISPOSABLE) ×2
KIT BASIN OR (CUSTOM PROCEDURE TRAY) ×2 IMPLANT
KIT ROOM TURNOVER OR (KITS) ×2 IMPLANT
MARKER SKIN DUAL TIP RULER LAB (MISCELLANEOUS) ×2 IMPLANT
NEEDLE HYPO 22GX1.5 SAFETY (NEEDLE) ×2 IMPLANT
NEEDLE SPNL 18GX3.5 QUINCKE PK (NEEDLE) ×2 IMPLANT
NS IRRIG 1000ML POUR BTL (IV SOLUTION) ×2 IMPLANT
PACK LAMINECTOMY NEURO (CUSTOM PROCEDURE TRAY) ×2 IMPLANT
PIN DISTRACTION 14MM (PIN) ×4 IMPLANT
PLATE ANT CERV XTEND ELD 1 L12 (Plate) ×2 IMPLANT
PUTTY BIOACTIVE 2CC KINEX (Miscellaneous) ×2 IMPLANT
RUBBERBAND STERILE (MISCELLANEOUS) ×4 IMPLANT
SCREW XTD VAR 4.2 SELF TAP (Screw) ×8 IMPLANT
SPONGE INTESTINAL PEANUT (DISPOSABLE) ×4 IMPLANT
SPONGE SURGIFOAM ABS GEL SZ50 (HEMOSTASIS) ×2 IMPLANT
STRIP CLOSURE SKIN 1/2X4 (GAUZE/BANDAGES/DRESSINGS) ×2 IMPLANT
SUT VIC AB 0 CT1 27 (SUTURE) ×1
SUT VIC AB 0 CT1 27XBRD ANTBC (SUTURE) ×1 IMPLANT
SUT VIC AB 3-0 SH 8-18 (SUTURE) ×2 IMPLANT
SYR 20ML ECCENTRIC (SYRINGE) ×2 IMPLANT
TAPE CLOTH SURG 4X10 WHT LF (GAUZE/BANDAGES/DRESSINGS) ×2 IMPLANT
TOWEL OR 17X24 6PK STRL BLUE (TOWEL DISPOSABLE) ×2 IMPLANT
TOWEL OR 17X26 10 PK STRL BLUE (TOWEL DISPOSABLE) ×2 IMPLANT
VISTA S O 14X14X6MM (Trauma) ×2 IMPLANT
WATER STERILE IRR 1000ML POUR (IV SOLUTION) ×2 IMPLANT

## 2014-08-02 NOTE — Anesthesia Postprocedure Evaluation (Signed)
Anesthesia Post Note  Patient: Cody Knight  Procedure(s) Performed: Procedure(s) (LRB): CERVICAL FIVE-SIX ANTERIOR CERVICAL DECOMPRESSION WITH FUSION INTERBODY PROSTHESIS PLATING AND BONEGRAFT. (N/A)  Anesthesia type: General  Patient location: PACU  Post pain: Pain level controlled and Adequate analgesia  Post assessment: Post-op Vital signs reviewed, Patient's Cardiovascular Status Stable, Respiratory Function Stable, Patent Airway and Pain level controlled  Last Vitals:  Filed Vitals:   08/02/14 1245  BP: 155/92  Pulse: 54  Temp: 36.7 C  Resp: 16    Post vital signs: Reviewed and stable  Level of consciousness: awake, alert  and oriented  Complications: No apparent anesthesia complications

## 2014-08-02 NOTE — Transfer of Care (Signed)
Immediate Anesthesia Transfer of Care Note  Patient: Cody Knight  Procedure(s) Performed: Procedure(s): CERVICAL FIVE-SIX ANTERIOR CERVICAL DECOMPRESSION WITH FUSION INTERBODY PROSTHESIS PLATING AND BONEGRAFT. (N/A)  Patient Location: PACU  Anesthesia Type:General  Level of Consciousness: awake, alert , oriented and patient cooperative  Airway & Oxygen Therapy: Patient Spontanous Breathing and Patient connected to nasal cannula oxygen  Post-op Assessment: Report given to RN, Post -op Vital signs reviewed and stable, Patient moving all extremities and Patient moving all extremities X 4  Post vital signs: Reviewed and stable  Last Vitals:  Filed Vitals:   08/02/14 0611  BP: 124/70  Pulse: 52  Temp: 36.2 C  Resp: 18    Complications: No apparent anesthesia complications

## 2014-08-02 NOTE — Progress Notes (Signed)
Orthopedic Tech Progress Note Patient Details:  Cody Knight December 20, 1965 [id number] [place] called for brace Patient ID: [name], male   DOB: 04-24-1966, 49 y.o.   MRN: [id number]   [name] 08/02/2014, 12:35 PM

## 2014-08-02 NOTE — Progress Notes (Signed)
Subjective:  The patient is alert and pleasant. He is in no apparent distress.  Objective: Vital signs in last 24 hours: Temp:  [97.1 F (36.2 C)] 97.1 F (36.2 C) (03/03 0611) Pulse Rate:  [52] 52 (03/03 0611) Resp:  [18] 18 (03/03 0611) BP: (124)/(70) 124/70 mmHg (03/03 0611) SpO2:  [96 %] 96 % (03/03 0611) Weight:  [85.049 kg (187 lb 8 oz)] 85.049 kg (187 lb 8 oz) (03/03 0611)  Intake/Output from previous day:   Intake/Output this shift: Total I/O In: 1000 [I.V.:1000] Out: 100 [Blood:100]  Physical exam the patient is alert and pleasant. He is in no apparent distress. His dressing is clean and dry. There is no evidence of hematoma or shift. He is moving all 4 extremities well.  Lab Results: No results for input(s): WBC, HGB, HCT, PLT in the last 72 hours. BMET No results for input(s): NA, K, CL, CO2, GLUCOSE, BUN, CREATININE, CALCIUM in the last 72 hours.  Studies/Results: Dg Cervical Spine 2-3 Views  08/02/2014   CLINICAL DATA:  Operative imaging for anterior cervical disc fusion at C5-C6.  EXAM: CERVICAL SPINE - 2-3 VIEW  COMPARISON:  None.  FINDINGS: Submitted lateral images initially show placement of a surgical probe with its tip superimposed over the anterior aspect of the C4-C5 disc. Followup image shows placement of a fusion plate and fixation screws spanning C5-C6 with the lower aspect not well visualized due to overlying soft tissues. There is no acute fracture or evidence of an operative complication.  IMPRESSION: Localization imaging for cervical spine fusion as described.   Electronically Signed   By: Lajean Manes M.D.   On: 08/02/2014 10:01    Assessment/Plan: The patient is doing well.      Cort Dragoo D 08/02/2014, 10:10 AM

## 2014-08-02 NOTE — H&P (Signed)
Subjective: The patient is a 49 year old white male who has complained of neck pain with right greater than left shoulder and arm pain. He has failed medical management. He was worked up with a cervical MRI which demonstrated this generation and spondylosis at C5-6. I discussed the various treatment options with the patient including surgery. He has weighed the risks, benefits, and alternative surgery and decided proceed with before meals 56 anterior cervical discectomy, fusion, and plating.  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   Past Medical History  Diagnosis Date  . Chest pain   . Sinus bradycardia on ECG   . Cyst     left breast  . Back problem   . Headache   . Stroke   . Depression     Past Surgical History  Procedure Laterality Date  . Shoulder surgery      right shoulder arthroscopy    No Known Allergies  History  Substance Use Topics  . Smoking status: Current Every Day Smoker -- 1.00 packs/day for 20 years    Types: Cigarettes  . Smokeless tobacco: Never Used  . Alcohol Use: No    Family History  Problem Relation Age of Onset  . Hypertension Mother   . Diabetes Mother    Prior to Admission medications   Medication Sig Start Date End Date Taking? Authorizing Provider  aspirin 325 MG tablet Take 325 mg by mouth daily.   Yes Historical Provider, MD  gabapentin (NEURONTIN) 300 MG capsule Take 300 mg by mouth 2 (two) times daily.   Yes Historical Provider, MD  oxyCODONE-acetaminophen (PERCOCET/ROXICET) 5-325 MG per tablet Take 1 tablet by mouth daily as needed for severe pain.   Yes Historical Provider, MD  pantoprazole (PROTONIX) 40 MG tablet Take 40 mg by mouth daily.   Yes Historical Provider, MD  PARoxetine (PAXIL) 20 MG tablet Take 20 mg by mouth daily.   Yes Historical Provider, MD  rosuvastatin (CRESTOR) 20 MG tablet Take 20 mg by mouth daily.   Yes Historical Provider,  MD     Review of Systems  Positive ROS: As above  All other systems have been reviewed and were otherwise negative with the exception of those mentioned in the HPI and as above.  Objective: Vital signs in last 24 hours: Temp:  [97.1 F (36.2 C)] 97.1 F (36.2 C) (03/03 0611) Pulse Rate:  [52] 52 (03/03 0611) Resp:  [18] 18 (03/03 0611) BP: (124)/(70) 124/70 mmHg (03/03 0611) SpO2:  [96 %] 96 % (03/03 0611) Weight:  [85.049 kg (187 lb 8 oz)] 85.049 kg (187 lb 8 oz) (03/03 1324)  General Appearance: Alert, cooperative, no distress, Head: Normocephalic, without obvious abnormality, atraumatic Eyes: PERRL, conjunctiva/corneas clear, EOM's intact,    Ears: Normal  Throat: Normal  Neck: Supple, symmetrical, trachea midline, no adenopathy; thyroid: No enlargement/tenderness/nodules; no carotid bruit or JVD. The patient's right cervical incision is well-healed. Back: Symmetric, no curvature, ROM normal, no CVA tenderness Lungs: Clear to auscultation bilaterally, respirations unlabored Heart: Regular rate and rhythm, no murmur, rub or gallop Abdomen: Soft, non-tender,, no masses, no organomegaly Extremities: Extremities normal, atraumatic, no cyanosis or edema Pulses: 2+ and symmetric all extremities Skin: Skin color, texture, turgor normal, no rashes or lesions  NEUROLOGIC:   Mental status: alert and oriented, no aphasia, good attention span, Fund of knowledge/ memory ok Motor Exam - grossly normal Sensory Exam - grossly normal Reflexes:  Coordination - grossly normal Gait - grossly normal Balance - grossly normal Cranial Nerves: I: smell Not tested  II: visual acuity  OS: Normal  OD: Normal   II: visual fields Full to confrontation  II: pupils Equal, round, reactive to light  III,VII: ptosis None  III,IV,VI: extraocular muscles  Full ROM  V: mastication Normal  V: facial light touch sensation  Normal  V,VII: corneal reflex  Present  VII: facial muscle function - upper   Normal  VII: facial muscle function - lower Normal  VIII: hearing Not tested  IX: soft palate elevation  Normal  IX,X: gag reflex Present  XI: trapezius strength  5/5  XI: sternocleidomastoid strength 5/5  XI: neck flexion strength  5/5  XII: tongue strength  Normal    Data Review Lab Results  Component Value Date   WBC 8.3 07/19/2014   HGB 16.0 07/19/2014   HCT 46.8 07/19/2014   MCV 92.7 07/19/2014   PLT 236 07/19/2014   Lab Results  Component Value Date   NA 141 07/19/2014   K 3.8 07/19/2014   CL 108 07/19/2014   CO2 26 07/19/2014   BUN 17 07/19/2014   CREATININE 0.92 07/19/2014   GLUCOSE 109* 07/19/2014   No results found for: INR, PROTIME  Assessment/Plan: 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 I reviewed his MRI scan with him and pointed out the abnormalities. We have discussed the various treatment options including surgery. I have described the surgical treatment option of a C5-6 anterior cervical discectomy, fusion, and plating. I have shown him surgical models. We have discussed the risks, benefits, alternatives, and likelihood of achieving our goals with surgery. I have answered all patient's questions. He has decided to proceed with surgery.   Kelvin Burpee D 08/02/2014 7:07 AM

## 2014-08-02 NOTE — Anesthesia Procedure Notes (Signed)
Procedure Name: Intubation Date/Time: 08/02/2014 7:30 AM Performed by: Izora Gala Pre-anesthesia Checklist: Patient identified, Emergency Drugs available, Suction available and Patient being monitored Patient Re-evaluated:Patient Re-evaluated prior to inductionOxygen Delivery Method: Circle system utilized Preoxygenation: Pre-oxygenation with 100% oxygen Intubation Type: IV induction Ventilation: Mask ventilation without difficulty Laryngoscope Size: Miller and 3 Grade View: Grade I Tube type: Oral Tube size: 8.0 mm Number of attempts: 1 Airway Equipment and Method: Stylet and LTA kit utilized Placement Confirmation: ETT inserted through vocal cords under direct vision,  positive ETCO2 and breath sounds checked- equal and bilateral Secured at: 22 cm Tube secured with: Tape Dental Injury: Teeth and Oropharynx as per pre-operative assessment

## 2014-08-02 NOTE — Anesthesia Preprocedure Evaluation (Addendum)
Anesthesia Evaluation  Patient identified by MRN, date of birth, ID band Patient awake    Reviewed: Allergy & Precautions, NPO status , Patient's Chart, lab work & pertinent test results  History of Anesthesia Complications Negative for: history of anesthetic complications  Airway Mallampati: II       Dental  (+) Edentulous Upper, Edentulous Lower   Pulmonary Current Smoker,  Hx. Of 1ppd + rhonchi         Cardiovascular negative cardio ROS  Rhythm:Regular Rate:Bradycardia     Neuro/Psych  Headaches, Depression Oct 2015 mini strokes w/out residual, some confusion.  Rt. Carotid endarterectomy Oct.  2015 CVA negative psych ROS   GI/Hepatic negative GI ROS, Neg liver ROS,   Endo/Other  negative endocrine ROS  Renal/GU negative Renal ROS     Musculoskeletal negative musculoskeletal ROS (+)   Abdominal Normal abdominal exam  (+)   Peds  Hematology negative hematology ROS (+) ASA 81 mgs daily, stopped 1 week ago   Anesthesia Other Findings   Reproductive/Obstetrics negative OB ROS                            Anesthesia Physical Anesthesia Plan  ASA: III  Anesthesia Plan: General   Post-op Pain Management:    Induction: Intravenous  Airway Management Planned: Oral ETT  Additional Equipment:   Intra-op Plan:   Post-operative Plan: Extubation in OR  Informed Consent: I have reviewed the patients History and Physical, chart, labs and discussed the procedure including the risks, benefits and alternatives for the proposed anesthesia with the patient or authorized representative who has indicated his/her understanding and acceptance.   Dental advisory given  Plan Discussed with: CRNA, Anesthesiologist and Surgeon  Anesthesia Plan Comments:         Anesthesia Quick Evaluation

## 2014-08-02 NOTE — Op Note (Signed)
Brief history: The patient is a 49 year old white male who has complained of neck and arm pain. He has failed medical management. He was worked up with a cervical MRI and cervical x-rays which demonstrated disc degeneration and spondylosis. I discussed the various treatment options with the patient including surgery. He has weighed the risks, benefits, and alternative surgery and decided proceed with C5-6 anterior cervical discectomy, fusion, and plating.  Preoperative diagnosis: C5-6 disc degeneration, spondylosis, stenosis, cervicalgia, cervical radiculopathy  Postoperative diagnosis: The same  Procedure: C5-6 Anterior cervical discectomy/decompression; C5-6 interbody arthrodesis with local morcellized autograft bone and Kinnex bone graft extender; insertion of interbody prosthesis at C5-6 (Zimmer peek interbody prosthesis); anterior cervical plating from C5-6 with globus titanium plate  Surgeon: Dr. Earle Gell  Asst.: Dr. Michiel Sites  Anesthesia: Gen. endotracheal  Estimated blood loss: 75 mL  Drains: None  Complications: None  Description of procedure: The patient was brought to the operating room by the anesthesia team. General endotracheal anesthesia was induced. A roll was placed under the patient's shoulders to keep the neck in the neutral position. The patient's anterior cervical region was then prepared with Betadine scrub and Betadine solution. Sterile drapes were applied.  The area to be incised was then injected with Marcaine with epinephrine solution. I then used a scalpel to make a transverse incision in the patient's left anterior neck. I used the Metzenbaum scissors to divide the platysmal muscle and then to dissect medial to the sternocleidomastoid muscle, jugular vein, and carotid artery. I carefully dissected down towards the anterior cervical spine identifying the esophagus and retracting it medially. Then using Kitner swabs to clear soft tissue from the anterior  cervical spine. We then inserted a bent spinal needle into the upper exposed intervertebral disc space. We then obtained intraoperative radiographs confirm our location.  I then used electrocautery to detach the medial border of the longus colli muscle bilaterally from the C5-6 intervertebral disc spaces. I then inserted the Caspar self-retaining retractor underneath the longus colli muscle bilaterally to provide exposure.  We then incised the intervertebral disc at C5-6. We then performed a partial intervertebral discectomy with a pituitary forceps and the Karlin curettes. I then inserted distraction screws into the vertebral bodies at C5-6. We then distracted the interspace. We then used the high-speed drill to decorticate the vertebral endplates at N4-7, to drill away the remainder of the intervertebral disc, to drill away some posterior spondylosis, and to thin out the posterior longitudinal ligament. I then incised ligament with the arachnoid knife. We then removed the ligament with a Kerrison punches undercutting the vertebral endplates and decompressing the thecal sac. We then performed foraminotomies about the bilateral C6 nerve roots. This completed the decompression at this level.  We now turned our to attention to the interbody fusion. We used the trial spacers to determine the appropriate size for the interbody prosthesis. We then pre-filled prosthesis with a combination of local morcellized autograft bone that we obtained during decompression as well as Kinnex bone graft extender. We then inserted the prosthesis into the distracted interspace at C5-6. We then removed the distraction screws. There was a good snug fit of the prosthesis in the interspace.  Having completed the fusion we now turned attention to the anterior spinal instrumentation. We used the high-speed drill to drill away some anterior spondylosis at the disc spaces so that the plate lay down flat. We selected the appropriate  length titanium anterior cervical plate. We laid it along the anterior aspect of  the vertebral bodies from C5-6. We then drilled 14 mm holes at C5 and C6. We then secured the plate to the vertebral bodies by placing two 14 mm self-tapping screws at C5 and C6. We then obtained intraoperative radiograph. The demonstrating good position of the instrumentation. We therefore secured the screws the plate the locking each cam. This completed the instrumentation.  We then obtained hemostasis using bipolar electrocautery. We irrigated the wound out with bacitracin solution. We then removed the retractor. We inspected the esophagus for any damage. There was none apparent. We then reapproximated patient's platysmal muscle with interrupted 3-0 Vicryl suture. We then reapproximated the subcutaneous tissue with interrupted 3-0 Vicryl suture. The skin was reapproximated with Steri-Strips and benzoin. The wound was then covered with bacitracin ointment. A sterile dressing was applied. The drapes were removed. Patient was subsequently extubated by the anesthesia team and transported to the post anesthesia care unit in stable condition. All sponge instrument and needle counts were reportedly correct at the end of this case.

## 2014-08-03 ENCOUNTER — Encounter (HOSPITAL_COMMUNITY): Payer: Self-pay | Admitting: Neurosurgery

## 2014-08-03 DIAGNOSIS — M4722 Other spondylosis with radiculopathy, cervical region: Secondary | ICD-10-CM | POA: Diagnosis not present

## 2014-08-03 MED ORDER — DIAZEPAM 5 MG PO TABS: 5.0000 mg | ORAL_TABLET | Freq: Four times a day (QID) | ORAL | Status: DC | PRN

## 2014-08-03 MED ORDER — DOCUSATE SODIUM 100 MG PO CAPS: 100.0000 mg | ORAL_CAPSULE | Freq: Two times a day (BID) | ORAL | Status: DC

## 2014-08-03 MED ORDER — OXYCODONE-ACETAMINOPHEN 10-325 MG PO TABS: 1.0000 | ORAL_TABLET | ORAL | Status: DC | PRN

## 2014-08-03 NOTE — Progress Notes (Signed)
Pt doing well. Pt given D/C instructions with Rx's, verbal understanding was provided. Pt's incision is clean and dry with no sign of infection. Pt's IV was removed prior to D/C. Pt D/C'd home via wheelchair @ 0915 per MD order. Pt is stable @ D/C and has no other needs at this time. Sindhu Nguyen, RN  

## 2014-08-03 NOTE — Discharge Summary (Signed)
Physician Discharge Summary  Patient ID: Cody Knight MRN: [id number] DOB/AGE: Jul 25, 1965 49 y.o.  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 The surgery went well.  The patient's postoperative course was unremarkable. On postoperative day #1 he requested discharge to home. He was given oral and written discharge instructions. All his questions were answered.  Consults: None Significant Diagnostic Studies: None Treatments: C5-6 anterior cervical discectomy, fusion, and plating. Discharge Exam: Blood pressure 118/69, pulse 69, temperature 98.3 F (36.8 C), temperature source Oral, resp. rate 18, weight 85.049 kg (187 lb 8 oz), SpO2 97 %. The patient is alert and pleasant. He looks well. He is in no apparent distress. His dressing is clean and dry. There is no evidence of hematoma or shift. His strength is normal in all 4 extremities.  Disposition: Home  Discharge Instructions    Call MD for:  difficulty breathing, headache or visual disturbances    Complete by:  As directed      Call MD for:  extreme fatigue    Complete by:  As directed      Call MD for:  hives    Complete by:  As directed      Call MD for:  persistant dizziness or light-headedness    Complete by:  As directed      Call MD for:  persistant nausea and vomiting    Complete by:  As directed      Call MD for:  redness, tenderness, or signs of infection (pain, swelling, redness, odor or green/yellow discharge around incision site)    Complete by:  As directed      Call MD for:  severe uncontrolled pain    Complete by:  As directed      Call MD for:  temperature >100.4     Complete by:  As directed      Diet - low sodium heart healthy    Complete by:  As directed      Discharge instructions    Complete by:  As directed   Call [phone] for a followup appointment. Take a stool softener while you are using pain medications.     Driving Restrictions    Complete by:  As directed   Do not drive for 2 weeks.     Increase activity slowly    Complete by:  As directed      Lifting restrictions    Complete by:  As directed   Do not lift more than 5 pounds. No excessive bending or twisting.     May shower / Bathe    Complete by:  As directed   He may shower after the pain she is removed 3 days after surgery. Leave the incision alone.     Remove dressing in 48 hours    Complete by:  As directed   Your stitches are under the scan and will dissolve by themselves. The Steri-Strips will fall off after you take a few showers. Do not rub back or pick at the wound, Leave the wound alone.            Medication List    STOP taking these medications        oxyCODONE-acetaminophen 5-325 MG per tablet  Commonly known as:  PERCOCET/ROXICET  Replaced by:  oxyCODONE-acetaminophen 10-325 MG per tablet      TAKE these medications        aspirin 325 MG tablet  Take 325 mg by mouth daily.     diazepam 5 MG tablet  Commonly known as:  VALIUM  Take 1 tablet (5 mg total) by mouth every 6 (six) hours as needed for muscle spasms.     docusate sodium 100 MG capsule  Commonly known as:  COLACE  Take 1 capsule (100 mg total) by mouth 2 (two) times daily.     gabapentin 300 MG capsule  Commonly known as:  NEURONTIN  Take 300 mg by mouth 2 (two) times daily.     oxyCODONE-acetaminophen 10-325 MG per tablet  Commonly known as:  PERCOCET  Take 1 tablet by mouth every 4 (four) hours as needed for pain.     pantoprazole 40 MG tablet  Commonly known as:  PROTONIX  Take 40 mg by mouth daily.     PARoxetine 20 MG tablet  Commonly known as:  PAXIL  Take 20 mg by mouth  daily.     rosuvastatin 20 MG tablet  Commonly known as:  CRESTOR  Take 20 mg by mouth daily.         SignedOphelia Charter 08/03/2014, 8:33 AM

## 2014-08-03 NOTE — Discharge Instructions (Signed)

## 2014-09-11 DIAGNOSIS — F4323 Adjustment disorder with mixed anxiety and depressed mood: Secondary | ICD-10-CM | POA: Insufficient documentation

## 2015-01-22 ENCOUNTER — Other Ambulatory Visit: Payer: Self-pay | Admitting: Neurosurgery

## 2015-01-22 DIAGNOSIS — M542 Cervicalgia: Secondary | ICD-10-CM

## 2015-01-22 DIAGNOSIS — M4317 Spondylolisthesis, lumbosacral region: Secondary | ICD-10-CM

## 2015-02-05 ENCOUNTER — Ambulatory Visit
Admission: RE | Admit: 2015-02-05 | Discharge: 2015-02-05 | Disposition: A | Discharge status: Home / Self Care | Payer: BLUE CROSS/BLUE SHIELD | Source: Ambulatory Visit | Attending: Neurosurgery | Admitting: Neurosurgery

## 2015-02-05 ENCOUNTER — Other Ambulatory Visit: Payer: Self-pay

## 2015-02-05 VITALS — BP 151/84 | HR 52

## 2015-02-05 DIAGNOSIS — M4317 Spondylolisthesis, lumbosacral region: Secondary | ICD-10-CM

## 2015-02-05 DIAGNOSIS — M542 Cervicalgia: Secondary | ICD-10-CM

## 2015-02-05 DIAGNOSIS — M4722 Other spondylosis with radiculopathy, cervical region: Secondary | ICD-10-CM

## 2015-02-05 MED ORDER — ONDANSETRON HCL 4 MG/2ML IJ SOLN
4.0000 mg | Freq: Once | INTRAMUSCULAR | Status: AC
  Administered 2015-02-05: 4 mg via INTRAMUSCULAR

## 2015-02-05 MED ORDER — MEPERIDINE HCL 100 MG/ML IJ SOLN
100.0000 mg | Freq: Once | INTRAMUSCULAR | Status: AC
  Administered 2015-02-05: 100 mg via INTRAMUSCULAR

## 2015-02-05 MED ORDER — IOHEXOL 300 MG/ML  SOLN
10.0000 mL | Freq: Once | INTRAMUSCULAR | Status: DC | PRN
  Administered 2015-02-05: 10 mL via INTRATHECAL

## 2015-02-05 MED ORDER — DIAZEPAM 5 MG PO TABS
10.0000 mg | ORAL_TABLET | Freq: Once | ORAL | Status: AC
Start: 2015-02-05 — End: 2015-02-05
  Administered 2015-02-05: 10 mg via ORAL

## 2015-02-05 NOTE — Progress Notes (Signed)
Patient states he has been off Paxil for at least the past two days.  jkl

## 2015-02-05 NOTE — Discharge Instructions (Signed)
Myelogram Discharge Instructions  1. Go home and rest quietly for the next 24 hours.  It is important to lie flat for the next 24 hours.  Get up only to go to the restroom.  You may lie in the bed or on a couch on your back, your stomach, your left side or your right side.  You may have one pillow under your head.  You may have pillows between your knees while you are on your side or under your knees while you are on your back.  2. DO NOT drive today.  Recline the seat as far back as it will go, while still wearing your seat belt, on the way home.  3. You may get up to go to the bathroom as needed.  You may sit up for 10 minutes to eat.  You may resume your normal diet and medications unless otherwise indicated.  Drink plenty of extra fluids today and tomorrow.  4. The incidence of a spinal headache with nausea and/or vomiting is about 5% (one in 20 patients).  If you develop a headache, lie flat and drink plenty of fluids until the headache goes away.  Caffeinated beverages may be helpful.  If you develop severe nausea and vomiting or a headache that does not go away with flat bed rest, call [phone].  5. You may resume normal activities after your 24 hours of bed rest is over; however, do not exert yourself strongly or do any heavy lifting tomorrow.  6. Call your physician for a follow-up appointment.   You may resume Paxil on Wednesday, February 06, 2015 after 9:30a.m.

## 2015-05-23 ENCOUNTER — Other Ambulatory Visit: Payer: Self-pay | Admitting: Neurosurgery

## 2015-06-25 ENCOUNTER — Encounter (HOSPITAL_COMMUNITY)
Admission: RE | Admit: 2015-06-25 | Discharge: 2015-06-25 | Disposition: A | Discharge status: Home / Self Care | Payer: BLUE CROSS/BLUE SHIELD | Source: Ambulatory Visit | Attending: Neurosurgery | Admitting: Neurosurgery

## 2015-06-25 ENCOUNTER — Encounter (HOSPITAL_COMMUNITY): Payer: Self-pay

## 2015-06-25 DIAGNOSIS — Z01812 Encounter for preprocedural laboratory examination: Secondary | ICD-10-CM | POA: Insufficient documentation

## 2015-06-25 DIAGNOSIS — Z01818 Encounter for other preprocedural examination: Secondary | ICD-10-CM | POA: Insufficient documentation

## 2015-06-25 DIAGNOSIS — Z0183 Encounter for blood typing: Secondary | ICD-10-CM

## 2015-06-25 DIAGNOSIS — M4806 Spinal stenosis, lumbar region: Secondary | ICD-10-CM

## 2015-06-25 DIAGNOSIS — R001 Bradycardia, unspecified: Secondary | ICD-10-CM

## 2015-06-25 HISTORY — DX: Gastro-esophageal reflux disease without esophagitis: K21.9

## 2015-06-25 LAB — CBC
HEMATOCRIT: 48.5 % (ref 39.0–52.0)
HEMOGLOBIN: 16.8 g/dL (ref 13.0–17.0)
MCH: 33.4 pg (ref 26.0–34.0)
MCHC: 34.6 g/dL (ref 30.0–36.0)
MCV: 96.4 fL (ref 78.0–100.0)
Platelets: 219 10*3/uL (ref 150–400)
RBC: 5.03 MIL/uL (ref 4.22–5.81)
RDW: 13.2 % (ref 11.5–15.5)
WBC: 8.3 10*3/uL (ref 4.0–10.5)

## 2015-06-25 LAB — BASIC METABOLIC PANEL
Anion gap: 10 (ref 5–15)
BUN: 14 mg/dL (ref 6–20)
CALCIUM: 9.4 mg/dL (ref 8.9–10.3)
CHLORIDE: 111 mmol/L (ref 101–111)
CO2: 22 mmol/L (ref 22–32)
CREATININE: 0.94 mg/dL (ref 0.61–1.24)
GFR calc non Af Amer: >60 mL/min (ref >60)
Glucose, Bld: 90 mg/dL (ref 65–99)
Potassium: 4.4 mmol/L (ref 3.5–5.1)
SODIUM: 143 mmol/L (ref 135–145)

## 2015-06-25 LAB — TYPE AND SCREEN
ABO/RH(D): O POS
ANTIBODY SCREEN: NEGATIVE

## 2015-06-25 LAB — SURGICAL PCR SCREEN
MRSA, PCR: NEGATIVE
STAPHYLOCOCCUS AUREUS: NEGATIVE

## 2015-06-25 LAB — ABO/RH: ABO/RH(D): O POS

## 2015-06-25 MED ORDER — VANCOMYCIN HCL IN DEXTROSE 1-5 GM/200ML-% IV SOLN
1000.0000 mg | INTRAVENOUS | Status: AC
  Administered 2015-06-26: 1000 mg via INTRAVENOUS
  Filled 2015-06-25: qty 200

## 2015-06-25 NOTE — Pre-Procedure Instructions (Addendum)
Cody Knight  06/25/2015      [facility] [place], [place], [place] - [address] [place] [address] Phone: [phone] Fax: [phone]    Your procedure is scheduled on 06/26/15  Report to [facility] Admitting at 900 A.M.  Call this number if you have problems the morning of surgery:  [phone]   Remember:  Do not eat food or drink liquids after midnight.  Take these medicines the morning of surgery with A SIP OF WATER depakote, pain med if needed  STOP all herbel meds, nsaids (aleve,naproxen,advil,ibuprofen) NOW including vitamins, aspirin   Do not wear jewelry, make-up or nail polish.  Do not wear lotions, powders, or perfumes.  You may wear deodorant.  Do not shave 48 hours prior to surgery.  Men may shave face and neck.  Do not bring valuables to the hospital.  [facility] is not responsible for any belongings or valuables.  Contacts, dentures or bridgework may not be worn into surgery.  Leave your suitcase in the car.  After surgery it may be brought to your room.  For patients admitted to the hospital, discharge time will be determined by your treatment team.  Patients discharged the day of surgery will not be allowed to drive home.   Name and phone number of your driver:    Special instructions:   Special Instructions: [name] - Preparing for Surgery  Before surgery, you can play an important role.  Because skin is not sterile, your skin needs to be as free of germs as possible.  You can reduce the number of germs on you skin by washing with CHG (chlorahexidine gluconate) soap before surgery.  CHG is an antiseptic cleaner which kills germs and bonds with the skin to continue killing germs even after washing.  Please DO NOT use if you have an allergy to CHG or antibacterial soaps.  If your skin becomes reddened/irritated stop using the CHG and inform your nurse when you arrive at Short Stay.  Do not shave  (including legs and underarms) for at least 48 hours prior to the first CHG shower.  You may shave your face.  Please follow these instructions carefully:   1.  Shower with CHG Soap the night before surgery and the morning of Surgery.  2.  If you choose to wash your hair, wash your hair first as usual with your normal shampoo.  3.  After you shampoo, rinse your hair and body thoroughly to remove the Shampoo.  4.  Use CHG as you would any other liquid soap.  You can apply chg directly  to the skin and wash gently with scrungie or a clean washcloth.  5.  Apply the CHG Soap to your body ONLY FROM THE NECK DOWN.  Do not use on open wounds or open sores.  Avoid contact with your eyes ears, mouth and genitals (private parts).  Wash genitals (private parts)       with your normal soap.  6.  Wash thoroughly, paying special attention to the area where your surgery will be performed.  7.  Thoroughly rinse your body with warm water from the neck down.  8.  DO NOT shower/wash with your normal soap after using and rinsing off the CHG Soap.  9.  Pat yourself dry with a clean towel.            10.  Wear clean pajamas.  11.  Place clean sheets on your bed the night of your first shower and do not sleep with pets.  Day of Surgery  Do not apply any lotions/deodorants the morning of surgery.  Please wear clean clothes to the hospital/surgery center.  Please read over the following fact sheets that you were given. Pain Booklet, Coughing and Deep Breathing, Blood Transfusion Information, MRSA Information and Surgical Site Infection Prevention

## 2015-06-26 ENCOUNTER — Encounter (HOSPITAL_COMMUNITY): Payer: Self-pay | Admitting: Certified Registered Nurse Anesthetist

## 2015-06-26 ENCOUNTER — Inpatient Hospital Stay (HOSPITAL_COMMUNITY): Payer: BLUE CROSS/BLUE SHIELD | Admitting: Certified Registered Nurse Anesthetist

## 2015-06-26 ENCOUNTER — Encounter (HOSPITAL_COMMUNITY)
Admission: RE | Disposition: A | Discharge status: Home / Self Care | Payer: BLUE CROSS/BLUE SHIELD | Source: Ambulatory Visit | Attending: Neurosurgery

## 2015-06-26 ENCOUNTER — Inpatient Hospital Stay (HOSPITAL_COMMUNITY): Payer: BLUE CROSS/BLUE SHIELD

## 2015-06-26 ENCOUNTER — Inpatient Hospital Stay (HOSPITAL_COMMUNITY)
Admission: RE | Admit: 2015-06-26 | Discharge: 2015-07-01 | DRG: 460 | Disposition: A | Discharge status: Home / Self Care | Payer: BLUE CROSS/BLUE SHIELD | Source: Ambulatory Visit | Attending: Neurosurgery | Admitting: Neurosurgery

## 2015-06-26 DIAGNOSIS — Z7982 Long term (current) use of aspirin: Secondary | ICD-10-CM | POA: Diagnosis not present

## 2015-06-26 DIAGNOSIS — F329 Major depressive disorder, single episode, unspecified: Secondary | ICD-10-CM | POA: Diagnosis present

## 2015-06-26 DIAGNOSIS — Z8673 Personal history of transient ischemic attack (TIA), and cerebral infarction without residual deficits: Secondary | ICD-10-CM | POA: Diagnosis not present

## 2015-06-26 DIAGNOSIS — M5117 Intervertebral disc disorders with radiculopathy, lumbosacral region: Principal | ICD-10-CM | POA: Diagnosis present

## 2015-06-26 DIAGNOSIS — M5137 Other intervertebral disc degeneration, lumbosacral region: Secondary | ICD-10-CM | POA: Diagnosis present

## 2015-06-26 DIAGNOSIS — Z79899 Other long term (current) drug therapy: Secondary | ICD-10-CM

## 2015-06-26 DIAGNOSIS — F17210 Nicotine dependence, cigarettes, uncomplicated: Secondary | ICD-10-CM | POA: Diagnosis present

## 2015-06-26 DIAGNOSIS — Z419 Encounter for procedure for purposes other than remedying health state, unspecified: Secondary | ICD-10-CM

## 2015-06-26 DIAGNOSIS — K219 Gastro-esophageal reflux disease without esophagitis: Secondary | ICD-10-CM | POA: Diagnosis present

## 2015-06-26 DIAGNOSIS — Z888 Allergy status to other drugs, medicaments and biological substances status: Secondary | ICD-10-CM

## 2015-06-26 DIAGNOSIS — M4806 Spinal stenosis, lumbar region: Secondary | ICD-10-CM | POA: Diagnosis present

## 2015-06-26 DIAGNOSIS — M549 Dorsalgia, unspecified: Secondary | ICD-10-CM | POA: Diagnosis present

## 2015-06-26 SURGERY — POSTERIOR LUMBAR FUSION 1 LEVEL
Anesthesia: General

## 2015-06-26 MED ORDER — BUPIVACAINE LIPOSOME 1.3 % IJ SUSP
INTRAMUSCULAR | Status: DC | PRN
  Administered 2015-06-26: 20 mL

## 2015-06-26 MED ORDER — VANCOMYCIN HCL IN DEXTROSE 1-5 GM/200ML-% IV SOLN
1000.0000 mg | Freq: Two times a day (BID) | INTRAVENOUS | Status: DC
  Administered 2015-06-26 – 2015-06-29 (×7): 1000 mg via INTRAVENOUS
  Filled 2015-06-26 (×9): qty 200

## 2015-06-26 MED ORDER — LACTATED RINGERS IV SOLN
INTRAVENOUS | Status: DC
  Administered 2015-06-26 (×3): via INTRAVENOUS

## 2015-06-26 MED ORDER — LIDOCAINE HCL (CARDIAC) 20 MG/ML IV SOLN
INTRAVENOUS | Status: DC | PRN
  Administered 2015-06-26: 100 mg via INTRAVENOUS

## 2015-06-26 MED ORDER — LACTATED RINGERS IV SOLN
INTRAVENOUS | Status: DC
  Administered 2015-06-26: 21:00:00 via INTRAVENOUS

## 2015-06-26 MED ORDER — ROCURONIUM BROMIDE 50 MG/5ML IV SOLN
INTRAVENOUS | Status: AC
  Filled 2015-06-26: qty 1

## 2015-06-26 MED ORDER — DOCUSATE SODIUM 100 MG PO CAPS
100.0000 mg | ORAL_CAPSULE | Freq: Two times a day (BID) | ORAL | Status: DC
  Administered 2015-06-26 – 2015-07-01 (×10): 100 mg via ORAL
  Filled 2015-06-26 (×11): qty 1

## 2015-06-26 MED ORDER — HYDROCODONE-ACETAMINOPHEN 5-325 MG PO TABS: 1.0000 | ORAL_TABLET | ORAL | Status: DC | PRN

## 2015-06-26 MED ORDER — ONDANSETRON HCL 4 MG/2ML IJ SOLN
INTRAMUSCULAR | Status: AC
  Filled 2015-06-26: qty 2

## 2015-06-26 MED ORDER — PROPOFOL 10 MG/ML IV BOLUS
INTRAVENOUS | Status: AC
  Filled 2015-06-26: qty 20

## 2015-06-26 MED ORDER — FENTANYL CITRATE (PF) 250 MCG/5ML IJ SOLN
INTRAMUSCULAR | Status: AC
  Filled 2015-06-26: qty 5

## 2015-06-26 MED ORDER — MORPHINE SULFATE (PF) 2 MG/ML IV SOLN
1.0000 mg | INTRAVENOUS | Status: DC | PRN
  Administered 2015-06-26 – 2015-06-27 (×2): 2 mg via INTRAVENOUS
  Administered 2015-06-27 (×3): 4 mg via INTRAVENOUS
  Administered 2015-06-27: 2 mg via INTRAVENOUS
  Administered 2015-06-27 (×2): 4 mg via INTRAVENOUS
  Administered 2015-06-27 – 2015-06-28 (×3): 2 mg via INTRAVENOUS
  Administered 2015-06-28 (×3): 4 mg via INTRAVENOUS
  Filled 2015-06-26 (×4): qty 2
  Filled 2015-06-26: qty 1
  Filled 2015-06-26 (×4): qty 2
  Filled 2015-06-26 (×3): qty 1
  Filled 2015-06-26: qty 2
  Filled 2015-06-26: qty 1

## 2015-06-26 MED ORDER — ARTIFICIAL TEARS OP OINT
TOPICAL_OINTMENT | OPHTHALMIC | Status: AC
  Filled 2015-06-26: qty 3.5

## 2015-06-26 MED ORDER — OXYCODONE-ACETAMINOPHEN 5-325 MG PO TABS
1.0000 | ORAL_TABLET | ORAL | Status: DC | PRN
  Administered 2015-06-26 – 2015-06-27 (×5): 2 via ORAL
  Filled 2015-06-26 (×5): qty 2

## 2015-06-26 MED ORDER — ARTIFICIAL TEARS OP OINT
TOPICAL_OINTMENT | OPHTHALMIC | Status: DC | PRN
  Administered 2015-06-26: 1 via OPHTHALMIC

## 2015-06-26 MED ORDER — GLYCOPYRROLATE 0.2 MG/ML IJ SOLN
INTRAMUSCULAR | Status: DC | PRN
  Administered 2015-06-26: 0.4 mg via INTRAVENOUS

## 2015-06-26 MED ORDER — DIAZEPAM 5 MG PO TABS
5.0000 mg | ORAL_TABLET | Freq: Four times a day (QID) | ORAL | Status: DC | PRN
Start: 2015-06-26 — End: 2015-07-01
  Administered 2015-06-27 – 2015-07-01 (×11): 5 mg via ORAL
  Filled 2015-06-26 (×10): qty 1

## 2015-06-26 MED ORDER — VANCOMYCIN HCL 1000 MG IV SOLR
INTRAVENOUS | Status: AC
  Filled 2015-06-26: qty 1000

## 2015-06-26 MED ORDER — MEPERIDINE HCL 25 MG/ML IJ SOLN: 6.2500 mg | INTRAMUSCULAR | Status: DC | PRN

## 2015-06-26 MED ORDER — ROSUVASTATIN CALCIUM 20 MG PO TABS
20.0000 mg | ORAL_TABLET | Freq: Every day | ORAL | Status: DC
  Administered 2015-06-27 – 2015-07-01 (×5): 20 mg via ORAL
  Filled 2015-06-26 (×5): qty 1

## 2015-06-26 MED ORDER — NEOSTIGMINE METHYLSULFATE 10 MG/10ML IV SOLN
INTRAVENOUS | Status: DC | PRN
  Administered 2015-06-26: 3 mg via INTRAVENOUS

## 2015-06-26 MED ORDER — DIVALPROEX SODIUM 500 MG PO DR TAB
500.0000 mg | DELAYED_RELEASE_TABLET | Freq: Two times a day (BID) | ORAL | Status: DC
  Administered 2015-06-27 – 2015-07-01 (×9): 500 mg via ORAL
  Filled 2015-06-26 (×10): qty 1

## 2015-06-26 MED ORDER — BISACODYL 10 MG RE SUPP: 10.0000 mg | Freq: Every day | RECTAL | Status: DC | PRN

## 2015-06-26 MED ORDER — LIDOCAINE HCL (CARDIAC) 20 MG/ML IV SOLN
INTRAVENOUS | Status: AC
  Filled 2015-06-26: qty 5

## 2015-06-26 MED ORDER — ALBUMIN HUMAN 5 % IV SOLN
INTRAVENOUS | Status: DC | PRN
  Administered 2015-06-26: 16:00:00 via INTRAVENOUS

## 2015-06-26 MED ORDER — HYDROMORPHONE HCL 1 MG/ML IJ SOLN
INTRAMUSCULAR | Status: AC
  Administered 2015-06-26: 0.5 mg via INTRAVENOUS
  Filled 2015-06-26: qty 1

## 2015-06-26 MED ORDER — PROPOFOL 10 MG/ML IV BOLUS
INTRAVENOUS | Status: DC | PRN
  Administered 2015-06-26: 200 mg via INTRAVENOUS

## 2015-06-26 MED ORDER — PROMETHAZINE HCL 25 MG/ML IJ SOLN: 6.2500 mg | INTRAMUSCULAR | Status: DC | PRN

## 2015-06-26 MED ORDER — MENTHOL 3 MG MT LOZG: 1.0000 | LOZENGE | OROMUCOSAL | Status: DC | PRN

## 2015-06-26 MED ORDER — PHENOL 1.4 % MT LIQD: 1.0000 | OROMUCOSAL | Status: DC | PRN

## 2015-06-26 MED ORDER — ACETAMINOPHEN 325 MG PO TABS
650.0000 mg | ORAL_TABLET | ORAL | Status: DC | PRN
  Administered 2015-06-28 – 2015-07-01 (×6): 650 mg via ORAL
  Filled 2015-06-26 (×6): qty 2

## 2015-06-26 MED ORDER — CYCLOBENZAPRINE HCL 10 MG PO TABS
10.0000 mg | ORAL_TABLET | Freq: Three times a day (TID) | ORAL | Status: DC | PRN
  Administered 2015-06-26 – 2015-06-29 (×8): 10 mg via ORAL
  Filled 2015-06-26 (×7): qty 1

## 2015-06-26 MED ORDER — PAROXETINE HCL 20 MG PO TABS
30.0000 mg | ORAL_TABLET | Freq: Every day | ORAL | Status: DC
  Administered 2015-06-27 – 2015-07-01 (×5): 30 mg via ORAL
  Filled 2015-06-26 (×6): qty 2

## 2015-06-26 MED ORDER — 0.9 % SODIUM CHLORIDE (POUR BTL) OPTIME
TOPICAL | Status: DC | PRN
  Administered 2015-06-26: 1000 mL

## 2015-06-26 MED ORDER — ALUM & MAG HYDROXIDE-SIMETH 200-200-20 MG/5ML PO SUSP: 30.0000 mL | Freq: Four times a day (QID) | ORAL | Status: DC | PRN

## 2015-06-26 MED ORDER — MIDAZOLAM HCL 5 MG/5ML IJ SOLN
INTRAMUSCULAR | Status: DC | PRN
  Administered 2015-06-26: 2 mg via INTRAVENOUS

## 2015-06-26 MED ORDER — CYCLOBENZAPRINE HCL 10 MG PO TABS
ORAL_TABLET | ORAL | Status: AC
  Administered 2015-06-26: 10 mg via ORAL
  Filled 2015-06-26: qty 1

## 2015-06-26 MED ORDER — BUPIVACAINE-EPINEPHRINE (PF) 0.5% -1:200000 IJ SOLN
INTRAMUSCULAR | Status: DC | PRN
  Administered 2015-06-26: 10 mL

## 2015-06-26 MED ORDER — ROCURONIUM BROMIDE 100 MG/10ML IV SOLN
INTRAVENOUS | Status: DC | PRN
  Administered 2015-06-26 (×2): 10 mg via INTRAVENOUS
  Administered 2015-06-26: 40 mg via INTRAVENOUS
  Administered 2015-06-26: 20 mg via INTRAVENOUS

## 2015-06-26 MED ORDER — ONDANSETRON HCL 4 MG/2ML IJ SOLN: 4.0000 mg | INTRAMUSCULAR | Status: DC | PRN

## 2015-06-26 MED ORDER — ACETAMINOPHEN 650 MG RE SUPP: 650.0000 mg | RECTAL | Status: DC | PRN

## 2015-06-26 MED ORDER — BUPIVACAINE LIPOSOME 1.3 % IJ SUSP
20.0000 mL | INTRAMUSCULAR | Status: AC
  Filled 2015-06-26: qty 20

## 2015-06-26 MED ORDER — FENTANYL CITRATE (PF) 100 MCG/2ML IJ SOLN
INTRAMUSCULAR | Status: DC | PRN
  Administered 2015-06-26 (×4): 50 ug via INTRAVENOUS
  Administered 2015-06-26: 100 ug via INTRAVENOUS
  Administered 2015-06-26: 150 ug via INTRAVENOUS
  Administered 2015-06-26 (×3): 50 ug via INTRAVENOUS

## 2015-06-26 MED ORDER — HYDROMORPHONE HCL 1 MG/ML IJ SOLN
0.2500 mg | INTRAMUSCULAR | Status: DC | PRN
  Administered 2015-06-26 (×4): 0.5 mg via INTRAVENOUS

## 2015-06-26 MED ORDER — BACITRACIN ZINC 500 UNIT/GM EX OINT
TOPICAL_OINTMENT | CUTANEOUS | Status: DC | PRN
  Administered 2015-06-26: 1 via TOPICAL

## 2015-06-26 MED ORDER — ONDANSETRON HCL 4 MG/2ML IJ SOLN
INTRAMUSCULAR | Status: DC | PRN
  Administered 2015-06-26: 4 mg via INTRAVENOUS

## 2015-06-26 MED ORDER — SODIUM CHLORIDE 0.9 % IR SOLN
Status: DC | PRN
  Administered 2015-06-26: 13:00:00

## 2015-06-26 MED ORDER — MIDAZOLAM HCL 2 MG/2ML IJ SOLN
INTRAMUSCULAR | Status: AC
  Filled 2015-06-26: qty 2

## 2015-06-26 MED ORDER — VANCOMYCIN HCL 1000 MG IV SOLR
INTRAVENOUS | Status: DC | PRN
Start: 2015-06-26 — End: 2015-06-26
  Administered 2015-06-26: 1000 mg via TOPICAL

## 2015-06-26 MED ORDER — THROMBIN 20000 UNITS EX SOLR
CUTANEOUS | Status: DC | PRN
  Administered 2015-06-26: 13:00:00 via TOPICAL

## 2015-06-26 SURGICAL SUPPLY — 62 items
BAG DECANTER FOR FLEXI CONT (MISCELLANEOUS) ×2 IMPLANT
BENZOIN TINCTURE PRP APPL 2/3 (GAUZE/BANDAGES/DRESSINGS) ×2 IMPLANT
BLADE CLIPPER SURG (BLADE) IMPLANT
BRUSH SCRUB EZ PLAIN DRY (MISCELLANEOUS) ×2 IMPLANT
BUR MATCHSTICK NEURO 3.0 LAGG (BURR) ×2 IMPLANT
BUR PRECISION FLUTE 6.0 (BURR) ×2 IMPLANT
CANISTER SUCT 3000ML PPV (MISCELLANEOUS) ×2 IMPLANT
CAP REVERE LOCKING (Cap) ×8 IMPLANT
CONT SPEC 4OZ CLIKSEAL STRL BL (MISCELLANEOUS) ×2 IMPLANT
COVER BACK TABLE 60X90IN (DRAPES) ×2 IMPLANT
DRAPE C-ARM 42X72 X-RAY (DRAPES) ×4 IMPLANT
DRAPE LAPAROTOMY 100X72X124 (DRAPES) ×2 IMPLANT
DRAPE POUCH INSTRU U-SHP 10X18 (DRAPES) ×2 IMPLANT
DRAPE PROXIMA HALF (DRAPES) ×4 IMPLANT
DRAPE SURG 17X23 STRL (DRAPES) ×8 IMPLANT
ELECT BLADE 4.0 EZ CLEAN MEGAD (MISCELLANEOUS) ×2
ELECT REM PT RETURN 9FT ADLT (ELECTROSURGICAL) ×2
ELECTRODE BLDE 4.0 EZ CLN MEGD (MISCELLANEOUS) ×1 IMPLANT
ELECTRODE REM PT RTRN 9FT ADLT (ELECTROSURGICAL) ×1 IMPLANT
EVACUATOR 1/8 PVC DRAIN (DRAIN) IMPLANT
GAUZE SPONGE 4X4 12PLY STRL (GAUZE/BANDAGES/DRESSINGS) ×2 IMPLANT
GAUZE SPONGE 4X4 16PLY XRAY LF (GAUZE/BANDAGES/DRESSINGS) ×2 IMPLANT
GLOVE BIO SURGEON STRL SZ8 (GLOVE) ×4 IMPLANT
GLOVE BIO SURGEON STRL SZ8.5 (GLOVE) ×4 IMPLANT
GLOVE EXAM NITRILE LRG STRL (GLOVE) IMPLANT
GLOVE EXAM NITRILE MD LF STRL (GLOVE) IMPLANT
GLOVE EXAM NITRILE XL STR (GLOVE) IMPLANT
GLOVE EXAM NITRILE XS STR PU (GLOVE) IMPLANT
GOWN STRL REUS W/ TWL LRG LVL3 (GOWN DISPOSABLE) IMPLANT
GOWN STRL REUS W/ TWL XL LVL3 (GOWN DISPOSABLE) ×2 IMPLANT
GOWN STRL REUS W/TWL 2XL LVL3 (GOWN DISPOSABLE) IMPLANT
GOWN STRL REUS W/TWL LRG LVL3 (GOWN DISPOSABLE)
GOWN STRL REUS W/TWL XL LVL3 (GOWN DISPOSABLE) ×2
KIT BASIN OR (CUSTOM PROCEDURE TRAY) ×2 IMPLANT
KIT ROOM TURNOVER OR (KITS) ×2 IMPLANT
NEEDLE HYPO 21X1.5 SAFETY (NEEDLE) IMPLANT
NEEDLE HYPO 22GX1.5 SAFETY (NEEDLE) ×2 IMPLANT
NS IRRIG 1000ML POUR BTL (IV SOLUTION) ×2 IMPLANT
PACK LAMINECTOMY NEURO (CUSTOM PROCEDURE TRAY) ×2 IMPLANT
PAD ARMBOARD 7.5X6 YLW CONV (MISCELLANEOUS) ×6 IMPLANT
PATTIES SURGICAL .5 X.5 (GAUZE/BANDAGES/DRESSINGS) ×2 IMPLANT
PATTIES SURGICAL .5 X1 (DISPOSABLE) IMPLANT
PATTIES SURGICAL 1X1 (DISPOSABLE) ×2 IMPLANT
ROD REVERE 6.35 40MM (Rod) ×4 IMPLANT
SCREW REVERE 6.35 6.5MMX45 (Screw) ×4 IMPLANT
SCREW REVERE 6.35 6.5X40MM (Screw) ×4 IMPLANT
SPACER ALTERA 10X31 8-12MM-8 (Spacer) ×2 IMPLANT
SPONGE LAP 4X18 X RAY DECT (DISPOSABLE) IMPLANT
SPONGE NEURO XRAY DETECT 1X3 (DISPOSABLE) IMPLANT
SPONGE SURGIFOAM ABS GEL 100 (HEMOSTASIS) ×2 IMPLANT
STRIP BIOACTIVE 20CC 25X100X8 (Miscellaneous) ×2 IMPLANT
STRIP BIOACTIVE 5CC 25X50X4MM (Miscellaneous) ×2 IMPLANT
STRIP CLOSURE SKIN 1/2X4 (GAUZE/BANDAGES/DRESSINGS) ×2 IMPLANT
SUT VIC AB 1 CT1 18XBRD ANBCTR (SUTURE) ×3 IMPLANT
SUT VIC AB 1 CT1 8-18 (SUTURE) ×3
SUT VIC AB 2-0 CP2 18 (SUTURE) ×4 IMPLANT
SYR 20CC LL (SYRINGE) ×2 IMPLANT
TAPE CLOTH SURG 4X10 WHT LF (GAUZE/BANDAGES/DRESSINGS) ×2 IMPLANT
TOWEL OR 17X24 6PK STRL BLUE (TOWEL DISPOSABLE) ×2 IMPLANT
TOWEL OR 17X26 10 PK STRL BLUE (TOWEL DISPOSABLE) ×2 IMPLANT
TRAY FOLEY W/METER SILVER 14FR (SET/KITS/TRAYS/PACK) ×2 IMPLANT
WATER STERILE IRR 1000ML POUR (IV SOLUTION) ×2 IMPLANT

## 2015-06-26 NOTE — Progress Notes (Signed)
Subjective:  The patient is somnolent but arousable. He is in no apparent distress. He looks well.  Objective: Vital signs in last 24 hours: Temp:  [97.9 F (36.6 C)-98.4 F (36.9 C)] 97.9 F (36.6 C) (01/25 1640) Pulse Rate:  [53-83] 83 (01/25 1700) Resp:  [7-25] 9 (01/25 1700) BP: (107-146)/(66-89) 121/84 mmHg (01/25 1700) SpO2:  [97 %-100 %] 100 % (01/25 1700) Weight:  [86.229 kg (190 lb 1.6 oz)] 86.229 kg (190 lb 1.6 oz) (01/25 0910)  Intake/Output from previous day:   Intake/Output this shift: Total I/O In: 2250 [I.V.:2000; IV Piggyback:250] Out: 500 [Urine:200; Blood:300]  Physical exam the patient is somnolent but arousable. He is moving his lower extremities well.  Lab Results:  Recent Labs  06/25/15 1501  WBC 8.3  HGB 16.8  HCT 48.5  PLT 219   BMET  Recent Labs  06/25/15 1501  NA 143  K 4.4  CL 111  CO2 22  GLUCOSE 90  BUN 14  CREATININE 0.94  CALCIUM 9.4    Studies/Results: Dg Lumbar Spine 2-3 Views  06/26/2015  CLINICAL DATA:  L5-S1 PLIF for spinal stenosis. EXAM: LUMBAR SPINE - 2-3 VIEW COMPARISON:  Lateral radiograph of the lumbosacral spine 06/26/2015 FINDINGS: Frontal and lateral fluoroscopic images from L5-S1 fusion demonstrate interpedicular screws in satisfactory position within the L5 and S1 vertebral bodies. Disc spacer is also seen. Tissue retractors overly the posterior soft tissues. Fluoroscopy time is reported as 20 seconds. IMPRESSION: Intraoperative radiograph from L5-S1 posterior lumbar interbody fusion. Electronically Signed   By: Fidela Salisbury M.D.   On: 06/26/2015 16:44   Dg Lumbar Spine 1 View  06/26/2015  CLINICAL DATA:  L5-S1 spinal stenosis. Intraoperative localization film. Subsequent encounter. EXAM: LUMBAR SPINE - 1 VIEW COMPARISON:  Postmyelogram CT scan 02/05/2015. FINDINGS: A single intraoperative view of the lumbar spine in the lateral projection is provided. A probe is directed toward the L5-S1 interspace. No acute  abnormality is seen. IMPRESSION: L5-S1 localization. Electronically Signed   By: Inge Rise M.D.   On: 06/26/2015 16:43   Dg C-arm 1-60 Min  06/26/2015  CLINICAL DATA:  L5-S1 fusion EXAM: DG C-ARM 61-120 MIN COMPARISON:  06/26/2015 FINDINGS: Two intraoperative spot images demonstrate changes of L5-S1 discectomy and posterior fusion. Normal alignment. No hardware or bony complicating feature. IMPRESSION: L5-S1 posterior fusion. No complicating feature visualized on these intraoperative spot images. Electronically Signed   By: Rolm Baptise M.D.   On: 06/26/2015 16:41    Assessment/Plan: The patient is doing well.  LOS: 0 days     Jamiyah Dingley D 06/26/2015, 5:15 PM

## 2015-06-26 NOTE — Anesthesia Postprocedure Evaluation (Signed)
Anesthesia Post Note  Patient: Cody Knight  Procedure(s) Performed: Procedure(s) (LRB): Lumbar five -Sacral one posterior lumbar interbody fusion with interbody prosthesis posterior lateral arthrodesis and posterior nonsegmental instrumentation (N/A)  Patient location during evaluation: PACU Anesthesia Type: General Level of consciousness: sedated and patient cooperative Pain management: pain level controlled Vital Signs Assessment: post-procedure vital signs reviewed and stable Respiratory status: spontaneous breathing Cardiovascular status: stable Anesthetic complications: no    Last Vitals:  Filed Vitals:   06/26/15 1730 06/26/15 1733  BP:    Pulse: 84 81  Temp:    Resp: 11 14    Last Pain:  Filed Vitals:   06/26/15 1734  PainSc: 10-Worst pain ever      LLE Sensation: Full sensation;No numbness;No tingling (06/26/15 1730)   RLE Sensation: Full sensation;No numbness;No tingling (06/26/15 1730)      Nolon Nations

## 2015-06-26 NOTE — Anesthesia Preprocedure Evaluation (Addendum)
Anesthesia Evaluation  Patient identified by MRN, date of birth, ID band Patient awake    Reviewed: Allergy & Precautions, NPO status , Patient's Chart, lab work & pertinent test results  History of Anesthesia Complications Negative for: history of anesthetic complications  Airway Mallampati: II       Dental  (+) Edentulous Upper, Edentulous Lower   Pulmonary Current Smoker,  Hx. Of 1ppd   - rhonchi + decreased breath sounds      Cardiovascular + Peripheral Vascular Disease   Rhythm:Regular Rate:Bradycardia     Neuro/Psych  Headaches, PSYCHIATRIC DISORDERS Depression Oct 2015 mini strokes w/out residual, some confusion.  Rt. Carotid endarterectomy Oct.  2015 CVA    GI/Hepatic Neg liver ROS, GERD  ,  Endo/Other  negative endocrine ROS  Renal/GU negative Renal ROS     Musculoskeletal  (+) Arthritis ,   Abdominal Normal abdominal exam  (+)   Peds  Hematology negative hematology ROS (+) ASA 81 mgs daily, stopped 1 week ago   Anesthesia Other Findings   Reproductive/Obstetrics negative OB ROS                             Anesthesia Physical  Anesthesia Plan  ASA: III  Anesthesia Plan: General   Post-op Pain Management:    Induction: Intravenous  Airway Management Planned: Oral ETT  Additional Equipment:   Intra-op Plan:   Post-operative Plan: Extubation in OR  Informed Consent: I have reviewed the patients History and Physical, chart, labs and discussed the procedure including the risks, benefits and alternatives for the proposed anesthesia with the patient or authorized representative who has indicated his/her understanding and acceptance.   Dental advisory given  Plan Discussed with: CRNA, Anesthesiologist and Surgeon  Anesthesia Plan Comments:         Anesthesia Quick Evaluation

## 2015-06-26 NOTE — Progress Notes (Signed)
ANTIBIOTIC CONSULT NOTE - INITIAL  Pharmacy Consult for Vancomycin Indication: post-op prophylaxis  Allergies  Allergen Reactions  . Atorvastatin Other (See Comments)    Myalgia  . Cephalexin Nausea And Vomiting    Patient Measurements: Height: 5\' 9"  (175.3 cm) Weight: 190 lb 1.6 oz (86.229 kg) IBW/kg (Calculated) : 70.7  Vital Signs: Temp: 98.3 F (36.8 C) (01/25 1910) Temp Source: Oral (01/25 1910) BP: 113/63 mmHg (01/25 1910) Pulse Rate: 69 (01/25 1910) Intake/Output from previous day:   Intake/Output from this shift:    Labs:  Recent Labs  06/25/15 1501  WBC 8.3  HGB 16.8  PLT 219  CREATININE 0.94   Estimated Creatinine Clearance: 105.7 mL/min (by C-G formula based on Cr of 0.94). No results for input(s): VANCOTROUGH, VANCOPEAK, VANCORANDOM, GENTTROUGH, GENTPEAK, GENTRANDOM, TOBRATROUGH, TOBRAPEAK, TOBRARND, AMIKACINPEAK, AMIKACINTROU, AMIKACIN in the last 72 hours.   Microbiology: Recent Results (from the past 720 hour(s))  Surgical pcr screen     Status: None   Collection Time: 06/25/15  3:00 PM  Result Value Ref Range Status   MRSA, PCR NEGATIVE NEGATIVE Final   Staphylococcus aureus NEGATIVE NEGATIVE Final    Comment:        The Xpert SA Assay (FDA approved for NASAL specimens in patients over 50 years of age), is one component of a comprehensive surveillance program.  Test performance has been validated by [facility] for patients greater than or equal to 45 year old. It is not intended to diagnose infection nor to guide or monitor treatment.     Medical History: Past Medical History  Diagnosis Date  . Chest pain     in hospital with stroke  . Sinus bradycardia on ECG     in hospital with stroke  . Cyst     left breast  . Back problem   . Headache   . Depression   . GERD (gastroesophageal reflux disease)     occ  . Stroke ([place]) 10/15    slight weakness in hands per patient    Medications:  Prescriptions prior to admission   Medication Sig Dispense Refill Last Dose  . aspirin EC 81 MG tablet Take 81 mg by mouth daily.   06/25/2015 at Unknown time  . cyclobenzaprine (FLEXERIL) 10 MG tablet Take 10 mg by mouth 3 (three) times daily as needed for muscle spasms.   Past Month at Unknown time  . diazepam (VALIUM) 5 MG tablet Take 1 tablet (5 mg total) by mouth every 6 (six) hours as needed for muscle spasms. (Patient taking differently: Take 5 mg by mouth at bedtime as needed (insomnia). ) 50 tablet 1 Past Month at Unknown time  . divalproex (DEPAKOTE) 250 MG DR tablet Take 500 mg by mouth 2 (two) times daily.   06/26/2015 at 800  . HYDROcodone-acetaminophen (NORCO/VICODIN) 5-325 MG tablet Take 1 tablet by mouth every 6 (six) hours as needed for moderate pain.   06/26/2015 at 800  . PARoxetine (PAXIL) 30 MG tablet Take 30 mg by mouth daily.  3 06/25/2015 at Unknown time  . rosuvastatin (CRESTOR) 20 MG tablet Take 20 mg by mouth daily.   06/25/2015 at Unknown time   Assessment: 50 yo M s/p back decompression Knight.  Pt received Vancomycin pre-op today at noon.  Hemovac drain placed.  To continue Vancomycin until drain removed.  Goal of Therapy:  Vancomycin trough level 10-15 mcg/ml  Plan:  Start Vancomycin 1gm IV q12h - first dose tonight at 10pm. Follow-up for drain  removal and d/c antibiotic at that time.  [employer], Pharm.D., BCPS Clinical Pharmacist Pager [phone] 06/26/2015 7:29 PM

## 2015-06-26 NOTE — H&P (Signed)
Subjective: The patient is a 50 year old white male who has complained of back and leg pain. He has failed medical management. His workup with a lumbar MRI which demonstrated degenerative changes, stenosis, etc. and L5-S1. I discussed the situation with the patient. We discussed the various treatment options including surgery. He has weighed the risks, benefits, and alternative surgery decided proceed with an L5-S1 decompression, instrumentation, and fusion.   Past Medical History  Diagnosis Date  . Chest pain     in hospital with stroke  . Sinus bradycardia on ECG     in hospital with stroke  . Cyst     left breast  . Back problem   . Headache   . Depression   . GERD (gastroesophageal reflux disease)     occ  . Stroke ([place]) 10/15    slight weakness in hands per patient    Past Surgical History  Procedure Laterality Date  . Shoulder surgery      right shoulder arthroscopy  . Anterior cervical decomp/discectomy fusion N/A 08/02/2014    Procedure: CERVICAL FIVE-SIX ANTERIOR CERVICAL DECOMPRESSION WITH FUSION INTERBODY PROSTHESIS PLATING AND BONEGRAFT.;  Surgeon: Newman Pies, MD;  Location: [facility];  Service: Neurosurgery;  Laterality: N/A;    Allergies  Allergen Reactions  . Atorvastatin Other (See Comments)    Myalgia  . Cephalexin Nausea And Vomiting    Social History  Substance Use Topics  . Smoking status: Current Every Day Smoker -- 1.00 packs/day for 20 years    Types: Cigarettes  . Smokeless tobacco: Never Used  . Alcohol Use: No    Family History  Problem Relation Age of Onset  . Hypertension Mother   . Diabetes Mother    Prior to Admission medications   Medication Sig Start Date End Date Taking? Authorizing Provider  aspirin EC 81 MG tablet Take 81 mg by mouth daily.   Yes Historical Provider, MD  cyclobenzaprine (FLEXERIL) 10 MG tablet Take 10 mg by mouth 3 (three) times daily as needed for muscle spasms.   Yes Historical Provider, MD  diazepam  (VALIUM) 5 MG tablet Take 1 tablet (5 mg total) by mouth every 6 (six) hours as needed for muscle spasms. Patient taking differently: Take 5 mg by mouth at bedtime as needed (insomnia).  08/03/14  Yes Newman Pies, MD  divalproex (DEPAKOTE) 250 MG DR tablet Take 500 mg by mouth 2 (two) times daily.   Yes Historical Provider, MD  HYDROcodone-acetaminophen (NORCO/VICODIN) 5-325 MG tablet Take 1 tablet by mouth every 6 (six) hours as needed for moderate pain.   Yes Historical Provider, MD  PARoxetine (PAXIL) 30 MG tablet Take 30 mg by mouth daily. 06/10/15  Yes Historical Provider, MD  rosuvastatin (CRESTOR) 20 MG tablet Take 20 mg by mouth daily.   Yes Historical Provider, MD     Review of Systems  Positive ROS: As above  All other systems have been reviewed and were otherwise negative with the exception of those mentioned in the HPI and as above.  Objective: Vital signs in last 24 hours: Temp:  [98.4 F (36.9 C)-98.7 F (37.1 C)] 98.4 F (36.9 C) (01/25 0910) Pulse Rate:  [53-64] 53 (01/25 0910) Resp:  [18-20] 18 (01/25 0910) BP: (107-133)/(66-88) 107/66 mmHg (01/25 0910) SpO2:  [96 %-97 %] 97 % (01/25 0910) Weight:  [86.229 kg (190 lb 1.6 oz)] 86.229 kg (190 lb 1.6 oz) (01/25 0910)  General Appearance: Alert, cooperative, no distress, Head: Normocephalic, without obvious abnormality, atraumatic Eyes: PERRL,  conjunctiva/corneas clear, EOM's intact,    Ears: Normal  Throat: Normal  Neck: Supple, symmetrical, trachea midline, no adenopathy; thyroid: No enlargement/tenderness/nodules; no carotid bruit or JVD, the patient's cervical incision is well-healed. Back: Symmetric, no curvature, ROM normal, no CVA tenderness Lungs: Clear to auscultation bilaterally, respirations unlabored Heart: Regular rate and rhythm, no murmur, rub or gallop Abdomen: Soft, non-tender,, no masses, no organomegaly Extremities: Extremities normal, atraumatic, no cyanosis or edema Pulses: 2+ and symmetric all  extremities Skin: Skin color, texture, turgor normal, no rashes or lesions  NEUROLOGIC:   Mental status: alert and oriented, no aphasia, good attention span, Fund of knowledge/ memory ok Motor Exam - grossly normal Sensory Exam - grossly normal Reflexes:  Coordination - grossly normal Gait - grossly normal Balance - grossly normal Cranial Nerves: I: smell Not tested  II: visual acuity  OS: Normal  OD: Normal   II: visual fields Full to confrontation  II: pupils Equal, round, reactive to light  III,VII: ptosis None  III,IV,VI: extraocular muscles  Full ROM  V: mastication Normal  V: facial light touch sensation  Normal  V,VII: corneal reflex  Present  VII: facial muscle function - upper  Normal  VII: facial muscle function - lower Normal  VIII: hearing Not tested  IX: soft palate elevation  Normal  IX,X: gag reflex Present  XI: trapezius strength  5/5  XI: sternocleidomastoid strength 5/5  XI: neck flexion strength  5/5  XII: tongue strength  Normal    Data Review Lab Results  Component Value Date   WBC 8.3 06/25/2015   HGB 16.8 06/25/2015   HCT 48.5 06/25/2015   MCV 96.4 06/25/2015   PLT 219 06/25/2015   Lab Results  Component Value Date   NA 143 06/25/2015   K 4.4 06/25/2015   CL 111 06/25/2015   CO2 22 06/25/2015   BUN 14 06/25/2015   CREATININE 0.94 06/25/2015   GLUCOSE 90 06/25/2015   No results found for: INR, PROTIME  Assessment/Plan: L5-S1 disc degeneration, foraminal stenosis, lumbago, lumbar radiculopathy: I have discussed the situation with the patient. I reviewed his MRI scan with him and pointed out the abnormalities. We have discussed the various treatment options including surgery. I described the surgical treatment option and L5-S1 decompression, instrumentation, and fusion. I have shown him surgical models. We have discussed the risks, benefits, alternatives, and likelihood of achieving her goals with surgery. I have answered all his questions.  He has decided to proceed with surgery.   Merry Pond D 06/26/2015 12:09 PM

## 2015-06-26 NOTE — Anesthesia Procedure Notes (Signed)
Procedure Name: Intubation Date/Time: 06/26/2015 12:17 PM Performed by: Willeen Cass P Pre-anesthesia Checklist: Patient identified, Timeout performed, Emergency Drugs available, Suction available and Patient being monitored Patient Re-evaluated:Patient Re-evaluated prior to inductionOxygen Delivery Method: Circle system utilized Preoxygenation: Pre-oxygenation with 100% oxygen Intubation Type: IV induction Ventilation: Oral airway inserted - appropriate to patient size and Mask ventilation without difficulty Laryngoscope Size: Mac and 4 Grade View: Grade I Tube type: Oral Tube size: 7.5 mm Number of attempts: 1 Airway Equipment and Method: Stylet Placement Confirmation: ETT inserted through vocal cords under direct vision,  breath sounds checked- equal and bilateral and positive ETCO2 Secured at: 22 cm Tube secured with: Tape Dental Injury: Teeth and Oropharynx as per pre-operative assessment

## 2015-06-26 NOTE — Transfer of Care (Signed)
Immediate Anesthesia Transfer of Care Note  Patient: Cody Knight  Procedure(s) Performed: Procedure(s) with comments: Lumbar five -Sacral one posterior lumbar interbody fusion with interbody prosthesis posterior lateral arthrodesis and posterior nonsegmental instrumentation (N/A) - L5S1 posterior lumbar interbody fusion with interbody prosthesis posterior lateral arthrodesis and posterior nonsegmental instrumentation  Patient Location: PACU  Anesthesia Type:General  Level of Consciousness: awake, alert , oriented and patient cooperative  Airway & Oxygen Therapy: Patient Spontanous Breathing and Patient connected to nasal cannula oxygen  Post-op Assessment: Report given to RN, Post -op Vital signs reviewed and stable and Patient moving all extremities X 4  Post vital signs: Reviewed and stable  Last Vitals:  Filed Vitals:   06/26/15 0910  BP: 107/66  Pulse: 53  Temp: 36.9 C  Resp: 18    Complications: No apparent anesthesia complications

## 2015-06-26 NOTE — Op Note (Signed)
Brief history: The patient is a 50 year old white male who has complained of chronic back and leg pain. He has failed nonsurgical management. He was worked up with a lumbar MRI which demonstrated the patient had degenerative disc disease at L5-S1. I discussed the situation with the patient. We discussed the various treatment options including surgery. The patient has weighed the risks, benefits, and alternative surgery and decided to proceed with an L5-S1 decompression, instrumentation, and fusion.  Preoperative diagnosis: L5-S1 Degenerative disc disease, spinal stenosis compressing both the L5 and the S1 nerve roots; lumbago; lumbar radiculopathy  Postoperative diagnosis: The same  Procedure: Bilateral L5-S1 Laminotomy/foraminotomies to decompress the bilateral L5 and S1 nerve roots(the work required to do this was in addition to the work required to do the posterior lumbar interbody fusion because of the patient's spinal stenosis, facet arthropathy. Etc. requiring a wide decompression of the nerve roots.); L5-S1 posterior lumbar interbody fusion with local morselized autograft bone and Kinnex graft extender; insertion of interbody prosthesis at L5-S1 (globus peek expandable interbody prosthesis); posterior nonsegmental instrumentation from L5 to S1 with globus titanium pedicle screws and rods; posterior lateral arthrodesis at L5-S1 with local morselized autograft bone and Kinnex bone graft extender.  Surgeon: Dr. Earle Gell  Asst.: Dr. Sherley Bounds  Anesthesia: Gen. endotracheal  Estimated blood loss: 250 mL  Drains: One medium Hemovac  Complications: None  Description of procedure: The patient was brought to the operating room by the anesthesia team. General endotracheal anesthesia was induced. The patient was turned to the prone position on the Wilson frame. The patient's lumbosacral region was then prepared with Betadine scrub and Betadine solution. Sterile drapes were applied.  I then  injected the area to be incised with Marcaine with epinephrine solution. I then used the scalpel to make a linear midline incision over the L5-S1 interspace. I then used electrocautery to perform a bilateral subperiosteal dissection exposing the spinous process and lamina of L5-S1. We then obtained intraoperative radiograph to confirm our location. We then inserted the Verstrac retractor to provide exposure.  I began the decompression by using the high speed drill to perform laminotomies at L5-S1. We then used the Kerrison punches to widen the laminotomy and removed the ligamentum flavum at L5-S1. We used the Kerrison punches to remove the medial facets at L5-S1. We performed wide foraminotomies about the bilateral L5 and S1 nerve roots completing the decompression.  We now turned our attention to the posterior lumbar interbody fusion. I used a scalpel to incise the intervertebral disc at L5-S1 bilaterally. I then performed a partial intervertebral discectomy at L5-S1 bilaterally using the pituitary forceps. We prepared the vertebral endplates at 075-GRM bilaterally for the fusion by removing the soft tissues with the curettes. We then used the trial spacers to pick the appropriate sized interbody prosthesis. We prefilled his prosthesis with a combination of local morselized autograft bone that we obtained during the decompression as well as Kinnex bone graft extender. We inserted the prefilled prosthesis into the interspace at L5-S1, I then expanded the prosthesis. There was a good snug fit of the prosthesis in the interspace. We then filled and the remainder of the intervertebral disc space with local morselized autograft bone and Kinnex. This completed the posterior lumbar interbody arthrodesis.  We now turned attention to the instrumentation. Under fluoroscopic guidance we cannulated the bilateral L5 and S1 pedicles with the bone probe. We then removed the bone probe. We then tapped the pedicle with a 0.5  millimeter tap. We then  removed the tap. We probed inside the tapped pedicle with a ball probe to rule out cortical breaches. We then inserted a 7.5 x 45 and 40 millimeter pedicle screw into the L5 and S1 pedicles bilaterally under fluoroscopic guidance. We then palpated along the medial aspect of the pedicles to rule out cortical breaches. There were none. The nerve roots were not injured. We then connected the unilateral pedicle screws with a lordotic rod. We compressed the construct and secured the rod in place with the caps. We then tightened the caps appropriately. This completed the instrumentation from L5-S1.  We now turned our attention to the posterior lateral arthrodesis at L5-S1. We used the high-speed drill to decorticate the remainder of the facets, pars, transverse process at L5-S1 bilaterally. We then applied a combination of local morselized autograft bone and Kinnex bone graft extender over these decorticated posterior lateral structures. This completed the posterior lateral arthrodesis.  We then obtained hemostasis using bipolar electrocautery. We irrigated the wound out with bacitracin solution. We inspected the thecal sac and nerve roots and noted they were well decompressed. We then removed the retractor. I placed vancomycin powder in the wound We placed a medium Hemovac drain in the epidural space and tunneled out through separate stab wound. We reapproximated patient's thoracolumbar fascia with interrupted #1 Vicryl suture. We reapproximated patient's subcutaneous tissue with interrupted 2-0 Vicryl suture. The reapproximated patient's skin with Steri-Strips and benzoin. The wound was then coated with bacitracin ointment. A sterile dressing was applied. The drapes were removed. The patient was subsequently returned to the supine position where they were extubated by the anesthesia team. He was then transported to the post anesthesia care unit in stable condition. All sponge instrument and  needle counts were reportedly correct at the end of this case.

## 2015-06-26 NOTE — Progress Notes (Signed)
Patient transferred to 5M15. Alert and oriented x4, easy to arouse, states pain is "9/10." Moves all extremities well, foley intact, gauze dressing on back is clean, dry, intact, hemovac intact. Report given to night rn.

## 2015-06-27 LAB — BASIC METABOLIC PANEL
Anion gap: 7 (ref 5–15)
BUN: 7 mg/dL (ref 6–20)
CO2: 28 mmol/L (ref 22–32)
CREATININE: 0.9 mg/dL (ref 0.61–1.24)
Calcium: 8.2 mg/dL — ABNORMAL LOW (ref 8.9–10.3)
Chloride: 107 mmol/L (ref 101–111)
Glucose, Bld: 96 mg/dL (ref 65–99)
POTASSIUM: 3.8 mmol/L (ref 3.5–5.1)
SODIUM: 142 mmol/L (ref 135–145)

## 2015-06-27 LAB — CBC
HCT: 37.7 % — ABNORMAL LOW (ref 39.0–52.0)
Hemoglobin: 12.4 g/dL — ABNORMAL LOW (ref 13.0–17.0)
MCH: 32.3 pg (ref 26.0–34.0)
MCHC: 32.9 g/dL (ref 30.0–36.0)
MCV: 98.2 fL (ref 78.0–100.0)
PLATELETS: 171 10*3/uL (ref 150–400)
RBC: 3.84 MIL/uL — AB (ref 4.22–5.81)
RDW: 13.2 % (ref 11.5–15.5)
WBC: 9.9 10*3/uL (ref 4.0–10.5)

## 2015-06-27 MED ORDER — OXYCODONE HCL 5 MG PO TABS
15.0000 mg | ORAL_TABLET | ORAL | Status: DC | PRN
  Administered 2015-06-27 – 2015-06-30 (×13): 15 mg via ORAL
  Filled 2015-06-27 (×14): qty 3

## 2015-06-27 MED FILL — Sodium Chloride IV Soln 0.9%: INTRAVENOUS | Qty: 1000 | Status: AC

## 2015-06-27 MED FILL — Heparin Sodium (Porcine) Inj 1000 Unit/ML: INTRAMUSCULAR | Qty: 30 | Status: AC

## 2015-06-27 NOTE — Progress Notes (Signed)
Patient ID: Cody Knight, male   DOB: 10/14/1965, 50 y.o.   MRN: [id number] Subjective:  The patient is alert and pleasant. His back is appropriately sore. He looks well.  Objective: Vital signs in last 24 hours: Temp:  [97.9 F (36.6 C)-98.6 F (37 C)] 98.6 F (37 C) (01/26 0645) Pulse Rate:  [53-94] 65 (01/26 0645) Resp:  [7-25] 18 (01/26 0645) BP: (107-147)/(50-98) 115/58 mmHg (01/26 0645) SpO2:  [96 %-100 %] 96 % (01/26 0645) Weight:  [86.229 kg (190 lb 1.6 oz)] 86.229 kg (190 lb 1.6 oz) (01/25 0910)  Intake/Output from previous day: 01/25 0701 - 01/26 0700 In: 2750 [I.V.:2500; IV Piggyback:250] Out: 2930 [Urine:2305; Drains:325; Blood:300] Intake/Output this shift:    Physical exam the patient is alert and oriented. He is moving his lower extremities well.  Lab Results:  Recent Labs  06/25/15 1501 06/27/15 0600  WBC 8.3 9.9  HGB 16.8 12.4*  HCT 48.5 37.7*  PLT 219 171   BMET  Recent Labs  06/25/15 1501 06/27/15 0600  NA 143 142  K 4.4 3.8  CL 111 107  CO2 22 28  GLUCOSE 90 96  BUN 14 7  CREATININE 0.94 0.90  CALCIUM 9.4 8.2*    Studies/Results: Dg Lumbar Spine 2-3 Views  06/26/2015  CLINICAL DATA:  L5-S1 PLIF for spinal stenosis. EXAM: LUMBAR SPINE - 2-3 VIEW COMPARISON:  Lateral radiograph of the lumbosacral spine 06/26/2015 FINDINGS: Frontal and lateral fluoroscopic images from L5-S1 fusion demonstrate interpedicular screws in satisfactory position within the L5 and S1 vertebral bodies. Disc spacer is also seen. Tissue retractors overly the posterior soft tissues. Fluoroscopy time is reported as 20 seconds. IMPRESSION: Intraoperative radiograph from L5-S1 posterior lumbar interbody fusion. Electronically Signed   By: Fidela Salisbury M.D.   On: 06/26/2015 16:44   Dg Lumbar Spine 1 View  06/26/2015  CLINICAL DATA:  L5-S1 spinal stenosis. Intraoperative localization film. Subsequent encounter. EXAM: LUMBAR SPINE - 1 VIEW COMPARISON:  Postmyelogram CT  scan 02/05/2015. FINDINGS: A single intraoperative view of the lumbar spine in the lateral projection is provided. A probe is directed toward the L5-S1 interspace. No acute abnormality is seen. IMPRESSION: L5-S1 localization. Electronically Signed   By: Inge Rise M.D.   On: 06/26/2015 16:43   Dg C-arm 1-60 Min  06/26/2015  CLINICAL DATA:  L5-S1 fusion EXAM: DG C-ARM 61-120 MIN COMPARISON:  06/26/2015 FINDINGS: Two intraoperative spot images demonstrate changes of L5-S1 discectomy and posterior fusion. Normal alignment. No hardware or bony complicating feature. IMPRESSION: L5-S1 posterior fusion. No complicating feature visualized on these intraoperative spot images. Electronically Signed   By: Rolm Baptise M.D.   On: 06/26/2015 16:41    Assessment/Plan: Postop day #1: The patient is doing well. We will mobilize him. He will likely go home in a couple days.  LOS: 1 day     Cody Knight D 06/27/2015, 8:09 AM

## 2015-06-27 NOTE — Care Management Note (Signed)
Case Management Note  Patient Details  Name: Cody Knight MRN: [id number] Date of Birth: Jul 04, 1965  Subjective/Objective:                    Action/Plan: Patient was admitted for a PLIF. Lives at home with spouse. Will follow for discharge needs pending PT/OT evals and physician orders. Expected Discharge Date:                  Expected Discharge Plan:     In-House Referral:     Discharge planning Services     Post Acute Care Choice:    Choice offered to:     DME Arranged:    DME Agency:     HH Arranged:    HH Agency:     Status of Service:  In process, will continue to follow  Medicare Important Message Given:    Date Medicare IM Given:    Medicare IM give by:    Date Additional Medicare IM Given:    Additional Medicare Important Message give by:     If discussed at [place] of Stay Meetings, dates discussed:    Additional Comments:  Rolm Baptise, RN 06/27/2015, 9:13 AM [phone]

## 2015-06-27 NOTE — Evaluation (Signed)
Physical Therapy Evaluation Patient Details Name: Cody Knight MRN: [id number] DOB: 09/15/1965 Today's Date: 06/27/2015   History of Present Illness  pt admitted for L5-S1 PLIF. PMHx: ACDF 3/16, CVA 2015, GERD  Clinical Impression  Pt limited by pain but agreeable to attempt mobility and learn precautions. Pt reports pain medicine not assisting with pain significantly and denied attempting to sit OOB end of session. Pt and wife educated for back precautions, transfers, gait, brace wear, and recommendations with handout provided. Pt with decreased activity tolerance, mobility and gait who will benefit from acute therapy to maximize mobility, gait and independence to decrease burden of care.     Follow Up Recommendations Home health PT    Equipment Recommendations  3in1 (PT);Rolling walker with 5" wheels    Recommendations for Other Services       Precautions / Restrictions Precautions Precautions: Back Precaution Booklet Issued: Yes (comment) Required Braces or Orthoses: Spinal Brace Spinal Brace: Lumbar corset      Mobility  Bed Mobility Overal bed mobility: Needs Assistance Bed Mobility: Rolling;Sidelying to Sit;Sit to Sidelying Rolling: Supervision Sidelying to sit: Supervision     Sit to sidelying: Supervision General bed mobility comments: cues for sequence and precautions. bed flat no rail   Transfers Overall transfer level: Needs assistance   Transfers: Sit to/from Stand Sit to Stand: Min guard         General transfer comment: cues for hand placement, sequence and posture  Ambulation/Gait Ambulation/Gait assistance: Min guard Ambulation Distance (Feet): 30 Feet Assistive device: Rolling walker (2 wheeled) Gait Pattern/deviations: Shuffle;Decreased stride length   Gait velocity interpretation: Below normal speed for age/gender General Gait Details: pt maintaining bil knee flexion with gait with cues for position in RW and encouragement to maximize gait  with pt limited by pain  Stairs            Wheelchair Mobility    Modified Rankin (Stroke Patients Only)       Balance                                             Pertinent Vitals/Pain Pain Assessment: 0-10 Pain Score: 9  Pain Location: back at incision and bil hips Pain Descriptors / Indicators: Aching Pain Intervention(s): Limited activity within patient's tolerance;Repositioned;Patient requesting pain meds-RN notified;Monitored during session    Home Living Family/patient expects to be discharged to:: Private residence Living Arrangements: Spouse/significant other;Children Available Help at Discharge: Family;Available 24 hours/day Type of Home: House Home Access: Stairs to enter Entrance Stairs-Rails: None Entrance Stairs-Number of Steps: 2 Home Layout: Two level;Able to live on main level with bedroom/bathroom Home Equipment: None      Prior Function Level of Independence: Independent         Comments: 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     Hand Dominance        Extremity/Trunk Assessment   Upper Extremity Assessment: Overall WFL for tasks assessed           Lower Extremity Assessment:  (did not assess with resistance secondary to pt pain, grossly 3/5, no deficits with sensation)      Cervical / Trunk Assessment: Normal  Communication   Communication: No difficulties  Cognition Arousal/Alertness: Awake/alert Behavior During Therapy: WFL for tasks assessed/performed Overall Cognitive Status: Within Functional Limits for tasks assessed  General Comments      Exercises        Assessment/Plan    PT Assessment Patient needs continued PT services  PT Diagnosis Difficulty walking;Acute pain   PT Problem List Decreased activity tolerance;Decreased mobility;Pain;Decreased knowledge of precautions;Decreased knowledge of use of DME  PT Treatment Interventions  DME instruction;Gait training;Stair training;Functional mobility training;Therapeutic activities;Patient/family education   PT Goals (Current goals can be found in the Care Plan section) Acute Rehab PT Goals Patient Stated Goal: be able to walk without pain PT Goal Formulation: With patient/family Time For Goal Achievement: 07/04/15 Potential to Achieve Goals: Good    Frequency Min 5X/week   Barriers to discharge        Co-evaluation               End of Session Equipment Utilized During Treatment: Back brace Activity Tolerance: Patient limited by pain Patient left: in bed;with call bell/phone within reach;with family/visitor present Nurse Communication: Mobility status;Precautions;Patient requests pain meds         Time: 1338-1401 PT Time Calculation (min) (ACUTE ONLY): 23 min   Charges:   PT Evaluation $PT Eval Moderate Complexity: 1 Procedure PT Treatments $Therapeutic Activity: 8-22 mins   PT G Codes:        Melford Aase 06/27/2015, 2:12 PM Elwyn Reach, [place]

## 2015-06-27 NOTE — Progress Notes (Signed)
Patient rating pain "10/10" after pain medication administration. Patient educated about pain scale ratings, continued to rate pain as "10/10." Patient states percocet does not work for him, asking for doctor to be contacted since his pain is not under control. Dr. Arnoldo Morale paged x1.

## 2015-06-28 MED ORDER — HYDROMORPHONE 1 MG/ML IV SOLN
INTRAVENOUS | Status: DC
  Filled 2015-06-28: qty 25

## 2015-06-28 MED ORDER — DIPHENHYDRAMINE HCL 50 MG/ML IJ SOLN: 12.5000 mg | Freq: Four times a day (QID) | INTRAMUSCULAR | Status: DC | PRN

## 2015-06-28 MED ORDER — NALOXONE HCL 0.4 MG/ML IJ SOLN: 0.4000 mg | INTRAMUSCULAR | Status: DC | PRN

## 2015-06-28 MED ORDER — DIPHENHYDRAMINE HCL 12.5 MG/5ML PO ELIX: 12.5000 mg | ORAL_SOLUTION | Freq: Four times a day (QID) | ORAL | Status: DC | PRN

## 2015-06-28 MED ORDER — ONDANSETRON HCL 4 MG/2ML IJ SOLN: 4.0000 mg | Freq: Four times a day (QID) | INTRAMUSCULAR | Status: DC | PRN

## 2015-06-28 MED ORDER — SODIUM CHLORIDE 0.9% FLUSH: 9.0000 mL | INTRAVENOUS | Status: DC | PRN

## 2015-06-28 NOTE — Progress Notes (Signed)
Physical Therapy Treatment Patient Details Name: Cody Knight MRN: [id number] DOB: [birth year] Today's Date: 06/28/2015    History of Present Illness pt admitted for L5-S1 PLIF. PMHx: ACDF 3/16, CVA 2015, GERD    PT Comments    Pt having difficulty with pain management. Gait distance limited by pain. Pt positioned in recliner but he was unable to tolerate sitting up. Pt sat up x 5 minutes then required return to supine in bed. Increased assist required today for all aspects of functional mobility.  Follow Up Recommendations  Home health PT     Equipment Recommendations  3in1 (PT);Rolling walker with 5" wheels    Recommendations for Other Services       Precautions / Restrictions Precautions Precautions: Back Precaution Comments: Reviewed 3/3 back precautions. Required Braces or Orthoses: Spinal Brace Spinal Brace: Lumbar corset;Applied in sitting position    Mobility  Bed Mobility     Rolling: Min assist Sidelying to sit: Min assist     Sit to sidelying: Min assist General bed mobility comments: verbal cues for sequencing. HOB flat, no use of rails.  Transfers   Equipment used: Rolling walker (2 wheeled)   Sit to Stand: Min assist         General transfer comment: verbal cues for hand placement, sequencing and posture  Ambulation/Gait Ambulation/Gait assistance: Min guard Ambulation Distance (Feet): 20 Feet Assistive device: Rolling walker (2 wheeled) Gait Pattern/deviations: Shuffle;Decreased stride length;Trunk flexed Gait velocity: very slow Gait velocity interpretation: Below normal speed for age/gender General Gait Details: small short shuffle steps with hip and knee flexion bilat.   Stairs            Wheelchair Mobility    Modified Rankin (Stroke Patients Only)       Balance                                    Cognition Arousal/Alertness: Awake/alert Behavior During Therapy: WFL for tasks assessed/performed Overall  Cognitive Status: Within Functional Limits for tasks assessed                      Exercises      General Comments        Pertinent Vitals/Pain Pain Assessment: 0-10 Pain Score: 10-Worst pain ever Pain Location: sx site Pain Descriptors / Indicators: Aching;Moaning Pain Intervention(s): Limited activity within patient's tolerance;Monitored during session;RN gave pain meds during session;Repositioned    Home Living                      Prior Function            PT Goals (current goals can now be found in the care plan section) Acute Rehab PT Goals Patient Stated Goal: be able to walk without pain PT Goal Formulation: With patient/family Time For Goal Achievement: 07/04/15 Potential to Achieve Goals: Good Progress towards PT goals: Not progressing toward goals - comment (pain)    Frequency  Min 5X/week    PT Plan Current plan remains appropriate    Co-evaluation             End of Session Equipment Utilized During Treatment: Back brace;Gait belt Activity Tolerance: Patient limited by pain Patient left: in bed;with call bell/phone within reach     Time: 1025-1050 PT Time Calculation (min) (ACUTE ONLY): 25 min  Charges:  $Gait Training: 8-22 mins $Therapeutic Activity: 8-22 mins  G Codes:      Lorriane Shire 06/28/2015, 11:50 AM

## 2015-06-28 NOTE — Progress Notes (Signed)
Patient ID: Cody Knight, male   DOB: [birth year], 50 y.o.   MRN: [id number] Subjective:  The patient is alert and pleasant. He complains of back pain. He seems to have a high tolerance to pain medications likely from chronic use.  Objective: Vital signs in last 24 hours: Temp:  [98 F (36.7 C)-99.4 F (37.4 C)] 98.1 F (36.7 C) (01/27 0508) Pulse Rate:  [66-107] 98 (01/27 0508) Resp:  [18-22] 18 (01/27 0508) BP: (94-143)/(53-98) 143/77 mmHg (01/27 0508) SpO2:  [94 %-97 %] 95 % (01/27 0508)  Intake/Output from previous day: 01/26 0701 - 01/27 0700 In: -  Out: 500 [Urine:500] Intake/Output this shift:    Physical exam the patient is alert and pleasant. His strength is grossly normal in his lower extremities. His dressing is clean and dry.  Lab Results:  Recent Labs  06/25/15 1501 06/27/15 0600  WBC 8.3 9.9  HGB 16.8 12.4*  HCT 48.5 37.7*  PLT 219 171   BMET  Recent Labs  06/25/15 1501 06/27/15 0600  NA 143 142  K 4.4 3.8  CL 111 107  CO2 22 28  GLUCOSE 90 96  BUN 14 7  CREATININE 0.94 0.90  CALCIUM 9.4 8.2*    Studies/Results: Dg Lumbar Spine 2-3 Views  06/26/2015  CLINICAL DATA:  L5-S1 PLIF for spinal stenosis. EXAM: LUMBAR SPINE - 2-3 VIEW COMPARISON:  Lateral radiograph of the lumbosacral spine 06/26/2015 FINDINGS: Frontal and lateral fluoroscopic images from L5-S1 fusion demonstrate interpedicular screws in satisfactory position within the L5 and S1 vertebral bodies. Disc spacer is also seen. Tissue retractors overly the posterior soft tissues. Fluoroscopy time is reported as 20 seconds. IMPRESSION: Intraoperative radiograph from L5-S1 posterior lumbar interbody fusion. Electronically Signed   By: Fidela Salisbury M.D.   On: 06/26/2015 16:44   Dg Lumbar Spine 1 View  06/26/2015  CLINICAL DATA:  L5-S1 spinal stenosis. Intraoperative localization film. Subsequent encounter. EXAM: LUMBAR SPINE - 1 VIEW COMPARISON:  Postmyelogram CT scan 02/05/2015. FINDINGS: A  single intraoperative view of the lumbar spine in the lateral projection is provided. A probe is directed toward the L5-S1 interspace. No acute abnormality is seen. IMPRESSION: L5-S1 localization. Electronically Signed   By: Inge Rise M.D.   On: 06/26/2015 16:43   Dg C-arm 1-60 Min  06/26/2015  CLINICAL DATA:  L5-S1 fusion EXAM: DG C-ARM 61-120 MIN COMPARISON:  06/26/2015 FINDINGS: Two intraoperative spot images demonstrate changes of L5-S1 discectomy and posterior fusion. Normal alignment. No hardware or bony complicating feature. IMPRESSION: L5-S1 posterior fusion. No complicating feature visualized on these intraoperative spot images. Electronically Signed   By: Rolm Baptise M.D.   On: 06/26/2015 16:41    Assessment/Plan: Postop day #2: We will mobilize the patient. He will likely go home tomorrow. I gave him his discharge instructions and answered all his questions.  LOS: 2 days     Hong Timm D 06/28/2015, 7:48 AM

## 2015-06-29 NOTE — Progress Notes (Signed)
Patient ID: Cody Knight, male   DOB: 09-Mar-1966, 50 y.o.   MRN: [id number] BP 149/79 mmHg  Pulse 77  Temp(Src) 98.3 F (36.8 C) (Oral)  Resp 10  Ht 5\' 9"  (1.753 m)  Wt 86.229 kg (190 lb 1.6 oz)  BMI 28.06 kg/m2  SpO2 98% Lethargic, follows commands Will stop iv pain medication Will restart aspirin Possible discharge tomorrow

## 2015-06-29 NOTE — Progress Notes (Signed)
Physical Therapy Treatment Patient Details Name: Cody Knight MRN: [id number] DOB: June 30, 1965 Today's Date: 06/29/2015    History of Present Illness pt admitted for L5-S1 PLIF. PMHx: ACDF 3/16, CVA 2015, GERD    PT Comments    Patient making progress with mobility and gait.  Pain remains limiting factor.  Will initiate stair training in am.   Follow Up Recommendations  Home health PT;Supervision for mobility/OOB     Equipment Recommendations  3in1 (PT);Rolling walker with 5" wheels    Recommendations for Other Services       Precautions / Restrictions Precautions Precautions: Back Precaution Comments: Reviewed back precautions Required Braces or Orthoses: Spinal Brace Spinal Brace: Lumbar corset;Applied in sitting position Restrictions Weight Bearing Restrictions: No    Mobility  Bed Mobility Overal bed mobility: Needs Assistance Bed Mobility: Rolling;Sidelying to Sit;Sit to Sidelying Rolling: Min guard Sidelying to sit: Min assist     Sit to sidelying: Min assist General bed mobility comments: Verbal cues to maintain precautions.  Assist to raise trunk to sitting, and to return LE's onto bed to return to sidelying.  Patient able to don brace with min assist.  Transfers Overall transfer level: Needs assistance Equipment used: Rolling walker (2 wheeled) Transfers: Sit to/from Stand Sit to Stand: Min guard         General transfer comment: Verbal cues for hand placement.  Assist for safety only.  Ambulation/Gait Ambulation/Gait assistance: Min guard Ambulation Distance (Feet): 140 Feet Assistive device: Rolling walker (2 wheeled) Gait Pattern/deviations: Step-through pattern;Decreased stride length Gait velocity: decreased Gait velocity interpretation: Below normal speed for age/gender General Gait Details: Slow guarded gait.  Upright posture.   Stairs            Wheelchair Mobility    Modified Rankin (Stroke Patients Only)       Balance                                     Cognition Arousal/Alertness: Awake/alert Behavior During Therapy: WFL for tasks assessed/performed Overall Cognitive Status: Within Functional Limits for tasks assessed                      Exercises      General Comments        Pertinent Vitals/Pain Pain Assessment: 0-10 Pain Score: 10-Worst pain ever Pain Location: back and anterior LE's Pain Descriptors / Indicators: Aching Pain Intervention(s): Monitored during session;Repositioned;PCA encouraged    Home Living                      Prior Function            PT Goals (current goals can now be found in the care plan section) Acute Rehab PT Goals Patient Stated Goal: be able to walk without pain Progress towards PT goals: Progressing toward goals    Frequency  Min 5X/week    PT Plan Current plan remains appropriate    Co-evaluation             End of Session Equipment Utilized During Treatment: Back brace;Gait belt Activity Tolerance: Patient limited by pain Patient left: in bed;with call bell/phone within reach;with family/visitor present     Time: [id number] PT Time Calculation (min) (ACUTE ONLY): 28 min  Charges:  $Gait Training: 23-37 mins  G CodesDespina Pole 06/29/2015, 7:36 PM Carita Pian. Sanjuana Kava, [place] Pager [phone]

## 2015-06-29 NOTE — Progress Notes (Signed)
ANTIBIOTIC CONSULT NOTE  Pharmacy Consult for Vancomycin Indication: post-op prophylaxis  Allergies  Allergen Reactions  . Atorvastatin Other (See Comments)    Myalgia  . Cephalexin Nausea And Vomiting    Patient Measurements: Height: 5\' 9"  (175.3 cm) Weight: 190 lb 1.6 oz (86.229 kg) IBW/kg (Calculated) : 70.7  Vital Signs: Temp: 97.8 F (36.6 C) (01/28 1051) Temp Source: Oral (01/28 1051) BP: 129/73 mmHg (01/28 1051) Pulse Rate: 68 (01/28 1051) Intake/Output from previous day: 01/27 0701 - 01/28 0700 In: -  Out: 2 [Urine:2] Intake/Output from this shift:    Labs:  Recent Labs  06/27/15 0600  WBC 9.9  HGB 12.4*  PLT 171  CREATININE 0.90   Estimated Creatinine Clearance: 110.4 mL/min (by C-G formula based on Cr of 0.9).   Assessment: 50 yo M s/p back decompression surgery.  Pt received Vancomycin pre-op today at noon.  Hemovac drain placed.  To continue Vancomycin until drain removed. Likely discharged tomorrow.  Goal of Therapy:  Vancomycin trough level 10-15 mcg/ml  Plan:  Vancomycin 1gm IV q12h  Follow-up for drain removal and d/c antibiotic at that time.  Joya San, PharmD Clinical Pharmacy Resident Pager # [phone] 06/29/2015 2:00 PM

## 2015-06-30 MED ORDER — CARISOPRODOL 350 MG PO TABS
350.0000 mg | ORAL_TABLET | Freq: Three times a day (TID) | ORAL | Status: DC | PRN
  Administered 2015-06-30 – 2015-07-01 (×3): 350 mg via ORAL
  Filled 2015-06-30 (×3): qty 1

## 2015-06-30 MED ORDER — HYDROMORPHONE HCL 1 MG/ML IJ SOLN
1.0000 mg | Freq: Once | INTRAMUSCULAR | Status: AC
  Administered 2015-06-30: 1 mg via INTRAVENOUS
  Filled 2015-06-30: qty 1

## 2015-06-30 MED ORDER — OXYCODONE HCL ER 15 MG PO T12A
30.0000 mg | EXTENDED_RELEASE_TABLET | Freq: Two times a day (BID) | ORAL | Status: DC
  Administered 2015-06-30 – 2015-07-01 (×3): 30 mg via ORAL
  Filled 2015-06-30 (×3): qty 2

## 2015-06-30 MED ORDER — OXYCODONE HCL 5 MG PO TABS
5.0000 mg | ORAL_TABLET | ORAL | Status: DC | PRN
  Administered 2015-06-30 – 2015-07-01 (×7): 5 mg via ORAL
  Filled 2015-06-30 (×7): qty 1

## 2015-06-30 MED ORDER — GABAPENTIN 100 MG PO CAPS
200.0000 mg | ORAL_CAPSULE | Freq: Three times a day (TID) | ORAL | Status: DC
  Administered 2015-06-30 – 2015-07-01 (×3): 200 mg via ORAL
  Filled 2015-06-30 (×3): qty 2

## 2015-06-30 NOTE — Progress Notes (Signed)
Patient continues to complain of severe back pain and is requiring IV pain medication as well as high doses of oral opioids AVSS Full strength in his legs Incision looks good Add OxyContin Lower dose of immediate release oxycodone with higher frequency Change Flexeril to [employer] Add Neurontin

## 2015-06-30 NOTE — Progress Notes (Signed)
Physical Therapy Treatment Patient Details Name: Cody Knight MRN: [id number] DOB: 28-Feb-1966 Today's Date: 06/30/2015    History of Present Illness pt admitted for L5-S1 PLIF. PMHx: ACDF 3/16, CVA 2015, GERD    PT Comments    Patient with increased pain today.  Agreed to participate with PT.  Able to ambulate only 30' with RW and min assist, with increased pain at 10/10.  RN notified of pain level.  Unable to attempt stairs today.  Follow Up Recommendations  Home health PT;Supervision for mobility/OOB     Equipment Recommendations  3in1 (PT);Rolling walker with 5" wheels    Recommendations for Other Services       Precautions / Restrictions Precautions Precautions: Back Precaution Comments: Reviewed back precautions Required Braces or Orthoses: Spinal Brace Spinal Brace: Lumbar corset;Applied in sitting position Restrictions Weight Bearing Restrictions: No    Mobility  Bed Mobility Overal bed mobility: Needs Assistance Bed Mobility: Rolling;Sidelying to Sit;Sit to Sidelying Rolling: Min guard Sidelying to sit: Min guard     Sit to sidelying: Min assist General bed mobility comments: Assist to bring LE's onto bed to return to supine.     Transfers Overall transfer level: Needs assistance Equipment used: Rolling walker (2 wheeled) Transfers: Sit to/from Stand Sit to Stand: Min assist         General transfer comment: Verbal cues for hand placement.  Assist to power up to standing.  Cues to straighten knees.  Patient with knees and hips flexed in stance.  Ambulation/Gait Ambulation/Gait assistance: Min assist;+2 safety/equipment Ambulation Distance (Feet): 30 Feet Assistive device: Rolling walker (2 wheeled) Gait Pattern/deviations: Step-through pattern;Decreased step length - right;Decreased step length - left;Decreased stride length;Antalgic Gait velocity: decreased Gait velocity interpretation: Below normal speed for age/gender General Gait Details:  Verbal cues to straighten knees/hips - flexed posture.  Slow, shuffle steps with increased pain.  Chair behind patient for safety.   Stairs            Wheelchair Mobility    Modified Rankin (Stroke Patients Only)       Balance                                    Cognition Arousal/Alertness: Awake/alert Behavior During Therapy: WFL for tasks assessed/performed Overall Cognitive Status: Within Functional Limits for tasks assessed                      Exercises      General Comments        Pertinent Vitals/Pain Pain Assessment: 0-10 Pain Score: 10-Worst pain ever Pain Location: Back and into BLE's Pain Descriptors / Indicators: Shooting;Sharp Pain Intervention(s): Limited activity within patient's tolerance;Monitored during session;Premedicated before session;Repositioned;Patient requesting pain meds-RN notified    Home Living                      Prior Function            PT Goals (current goals can now be found in the care plan section) Acute Rehab PT Goals Patient Stated Goal: be able to walk without pain Progress towards PT goals: Not progressing toward goals - comment (Due to increase in pain today)    Frequency  Min 5X/week    PT Plan Current plan remains appropriate    Co-evaluation             End of Session Equipment Utilized  During Treatment: Back brace;Gait belt Activity Tolerance: Patient limited by pain Patient left: in bed;with call bell/phone within reach;with family/visitor present     Time: [id number] PT Time Calculation (min) (ACUTE ONLY): 23 min  Charges:  $Gait Training: 23-37 mins                    G Codes:      Despina Pole July 06, 2015, 4:13 PM Carita Pian. Sanjuana Kava, [place] Pager [phone]

## 2015-07-01 MED ORDER — CYCLOBENZAPRINE HCL 10 MG PO TABS: 10.0000 mg | ORAL_TABLET | Freq: Three times a day (TID) | ORAL | Status: DC | PRN

## 2015-07-01 MED ORDER — DOCUSATE SODIUM 100 MG PO CAPS: 100.0000 mg | ORAL_CAPSULE | Freq: Two times a day (BID) | ORAL | Status: DC

## 2015-07-01 MED ORDER — OXYCODONE HCL ER 30 MG PO T12A: 30.0000 mg | EXTENDED_RELEASE_TABLET | Freq: Two times a day (BID) | ORAL | Status: DC

## 2015-07-01 MED ORDER — OXYCODONE HCL 5 MG PO TABS: 5.0000 mg | ORAL_TABLET | ORAL | Status: DC | PRN

## 2015-07-01 MED ORDER — GABAPENTIN 300 MG PO CAPS: 300.0000 mg | ORAL_CAPSULE | Freq: Three times a day (TID) | ORAL | Status: DC

## 2015-07-01 NOTE — Progress Notes (Signed)
Pt discharged via wheelchair with RN and wife present. Escorted to car. IV d'c'd prior to discharge, no redness , edema drainage or tenderness at site. Pt understood and verbalized all discharge instructions, wife also instructed on discharge.

## 2015-07-01 NOTE — Progress Notes (Signed)
Physical Therapy Treatment Patient Details Name: Cody Knight MRN: [id number] DOB: 1966-02-07 Today's Date: 07/01/2015    History of Present Illness pt admitted for L5-S1 PLIF. PMHx: ACDF 3/16, CVA 2015, GERD    PT Comments    Noted Dr. Arnoldo Morale' discharge order and neither DME or HHPT were ordered. Contacted Kelli, Case Manager and apparently Dr. Arnoldo Morale did not feel these were indicated. Ambulated patient without RW (he had no unsteadiness with walking; he was unsteady with sit to stand x 1-posterior stagger which RW would not preclude). No family present and discussed with RN.    Follow Up Recommendations  Home health PT;Supervision/Assistance - 24 hour     Equipment Recommendations  None recommended by PT (did well without RW)    Recommendations for Other Services       Precautions / Restrictions Precautions Precautions: Back Precaution Comments: Reviewed back precautions; pt twisting to get out of chair on arrival Required Braces or Orthoses: Spinal Brace Spinal Brace: Lumbar corset;Applied in sitting position (pt required assist for proper alignment ) Restrictions Weight Bearing Restrictions: No    Mobility  Bed Mobility                  Transfers Overall transfer level: Needs assistance Equipment used: None Transfers: Sit to/from Stand Sit to Stand: Min guard         General transfer comment: on arrival, pt coming to stand and twisting with one hand on armrest (chair alarm going off). Again educated on need for assist as pt with staggering LOB backwards. Repeated sit to stand x 2 with no device  Ambulation/Gait Ambulation/Gait assistance: Min guard Ambulation Distance (Feet): 110 Feet Assistive device: None Gait Pattern/deviations: Step-through pattern;Decreased stride length;Wide base of support Gait velocity: decreased Gait velocity interpretation: Below normal speed for age/gender General Gait Details: no device used as MD did not order RW (per  Case Manager, he did not think it was indicated). Pt with upright posture, slow velocity with no unsteadiness noted   Stairs            Wheelchair Mobility    Modified Rankin (Stroke Patients Only)       Balance Overall balance assessment: Needs assistance Sitting-balance support: No upper extremity supported;Feet supported Sitting balance-Leahy Scale: Fair     Standing balance support: No upper extremity supported Standing balance-Leahy Scale: Poor Standing balance comment: initial posterior lean                    Cognition Arousal/Alertness: Awake/alert Behavior During Therapy: Flat affect Overall Cognitive Status: No family/caregiver present to determine baseline cognitive functioning       Memory: Decreased short-term memory (per notes, is baseline s/p CVA)              Exercises      General Comments General comments (skin integrity, edema, etc.): no family present      Pertinent Vitals/Pain Pain Assessment: Faces Faces Pain Scale: Hurts little more Pain Location: back Pain Descriptors / Indicators: Operative site guarding Pain Intervention(s): Limited activity within patient's tolerance;Monitored during session;Premedicated before session;Repositioned    Home Living                      Prior Function            PT Goals (current goals can now be found in the care plan section) Acute Rehab PT Goals Patient Stated Goal: be able to walk without pain Time For Goal  Achievement: 07/04/15 Progress towards PT goals: Progressing toward goals    Frequency  Min 5X/week    PT Plan Current plan remains appropriate (although MD did not fee HHPT indicated per Care Manager)    Co-evaluation             End of Session Equipment Utilized During Treatment: Back brace;Gait belt Activity Tolerance: Patient tolerated treatment well Patient left: with call bell/phone within reach;in chair;with chair alarm set     Time:  [id number] PT Time Calculation (min) (ACUTE ONLY): 18 min  Charges:  $Gait Training: 8-22 mins                    G Codes:      Shakeira Rhee July 10, 2015, 12:11 PM  Pager [phone]

## 2015-07-01 NOTE — Progress Notes (Signed)
Patient ambulated 100 feet with rolling walker, min assist ice pack to back to  Help with pain.

## 2015-07-01 NOTE — Progress Notes (Signed)
PT Cancellation Note  Patient Details Name: Cody Knight MRN: [id number] DOB: 14-Oct-1965   Cancelled Treatment:    Reason Eval/Treat Not Completed: Pain limiting ability to participate   Patient reports severe pain in Lt buttock and down lt leg. States he did not have this pain prior to surgery. "I know something is wrong. I think I need an xray"  Will reattempt after MD has seen patient to discuss.  Makaria Poarch 07/01/2015, 8:47 AM Pager [phone]

## 2015-07-01 NOTE — Discharge Summary (Signed)
Physician Discharge Summary  Patient ID: Cody Knight MRN: [id number] DOB/AGE: [birth year] 50 y.o.  Admit date: 06/26/2015 Discharge date: 07/01/2015  Admission Diagnoses: L5-S1 degenerative disease, lumbago, lumbar radiculopathy  Discharge Diagnoses: The same Active Problems:   Disc disease, degenerative, lumbar or lumbosacral   Discharged Condition: fair  Hospital Course: I performed an L5-S1 decompression, instrumentation, and fusion on the patient on 06/26/2015. The surgery went well.  The patient's postoperative course was remarkable only for trouble with pain management. This was attributed to the fact that he had a high tolerance to pain medication secondary to chronic use. His pain was managed with OxyContin with oxycodone for breakthrough pain.  On 07/01/2015 the patient requested discharge to home. He was given oral and written discharge instructions. All his questions were answered.  Consults: Physical therapy Significant Diagnostic Studies: None Treatments: L5-S1 decompression, instrumentation, and fusion. Discharge Exam: Blood pressure 146/79, pulse 84, temperature 97.9 F (36.6 C), temperature source Oral, resp. rate 18, height 5\' 9"  (1.753 m), weight 86.229 kg (190 lb 1.6 oz), SpO2 99 %. The patient is alert and pleasant. His strength is grossly normal in his lower extremities. His wound is healing well. His dressing is clean and dry.  Disposition: Home  Discharge Instructions    Call MD for:  difficulty breathing, headache or visual disturbances    Complete by:  As directed      Call MD for:  extreme fatigue    Complete by:  As directed      Call MD for:  hives    Complete by:  As directed      Call MD for:  persistant dizziness or light-headedness    Complete by:  As directed      Call MD for:  persistant nausea and vomiting    Complete by:  As directed      Call MD for:  redness, tenderness, or signs of infection (pain, swelling, redness, odor or  green/yellow discharge around incision site)    Complete by:  As directed      Call MD for:  severe uncontrolled pain    Complete by:  As directed      Call MD for:  temperature >100.4    Complete by:  As directed      Diet - low sodium heart healthy    Complete by:  As directed      Discharge instructions    Complete by:  As directed   Call [phone] for a followup appointment. Take a stool softener while you are using pain medications.     Driving Restrictions    Complete by:  As directed   Do not drive for 2 weeks.     Increase activity slowly    Complete by:  As directed      Lifting restrictions    Complete by:  As directed   Do not lift more than 5 pounds. No excessive bending or twisting.     May shower / Bathe    Complete by:  As directed   He may shower after the pain she is removed 3 days after surgery. Leave the incision alone.     No dressing needed    Complete by:  As directed             Medication List    STOP taking these medications        diazepam 5 MG tablet  Commonly known as:  VALIUM      TAKE these  medications        aspirin EC 81 MG tablet  Take 81 mg by mouth daily.     cyclobenzaprine 10 MG tablet  Commonly known as:  FLEXERIL  Take 10 mg by mouth 3 (three) times daily as needed for muscle spasms.     cyclobenzaprine 10 MG tablet  Commonly known as:  FLEXERIL  Take 1 tablet (10 mg total) by mouth 3 (three) times daily as needed for muscle spasms.     divalproex 250 MG DR tablet  Commonly known as:  DEPAKOTE  Take 500 mg by mouth 2 (two) times daily.     docusate sodium 100 MG capsule  Commonly known as:  COLACE  Take 1 capsule (100 mg total) by mouth 2 (two) times daily.     gabapentin 300 MG capsule  Commonly known as:  NEURONTIN  Take 1 capsule (300 mg total) by mouth 3 (three) times daily.     HYDROcodone-acetaminophen 5-325 MG tablet  Commonly known as:  NORCO/VICODIN  Take 1 tablet by mouth every 6 (six) hours as needed  for moderate pain.     oxyCODONE 5 MG immediate release tablet  Commonly known as:  Oxy IR/ROXICODONE  Take 1 tablet (5 mg total) by mouth every 3 (three) hours as needed for breakthrough pain.     oxyCODONE 30 MG 12 hr tablet  Take 30 mg by mouth every 12 (twelve) hours.     PARoxetine 30 MG tablet  Commonly known as:  PAXIL  Take 30 mg by mouth daily.     rosuvastatin 20 MG tablet  Commonly known as:  CRESTOR  Take 20 mg by mouth daily.         SignedNewman Pies D 07/01/2015, 10:05 AM

## 2015-07-01 NOTE — Care Management Note (Signed)
Case Management Note  Patient Details  Name: Joram Leiby MRN: [id number] Date of Birth: 07/29/1965  Subjective/Objective:                    Action/Plan: Patient discharging home today with self care. No orders for PT/DME per Dr Arnoldo Morale. No further needs per CM.   Expected Discharge Date:                  Expected Discharge Plan:  Home/Self Care  In-House Referral:     Discharge planning Services     Post Acute Care Choice:    Choice offered to:     DME Arranged:    DME Agency:     HH Arranged:    [place] Agency:     Status of Service:  Completed, signed off  Medicare Important Message Given:    Date Medicare IM Given:    Medicare IM give by:    Date Additional Medicare IM Given:    Additional Medicare Important Message give by:     If discussed at [place] of Stay Meetings, dates discussed:    Additional Comments:  Pollie Friar, RN 07/01/2015, 10:25 AM

## 2015-10-07 IMAGING — CR DG CERVICAL SPINE 2 OR 3 VIEWS
1 series · 1 of 1 positions shown · non-contrast
Comparison: None.

CLINICAL DATA: Operative imaging for anterior cervical disc fusion
at C5-C6.

EXAM:
CERVICAL SPINE - 2-3 VIEW

[lat]
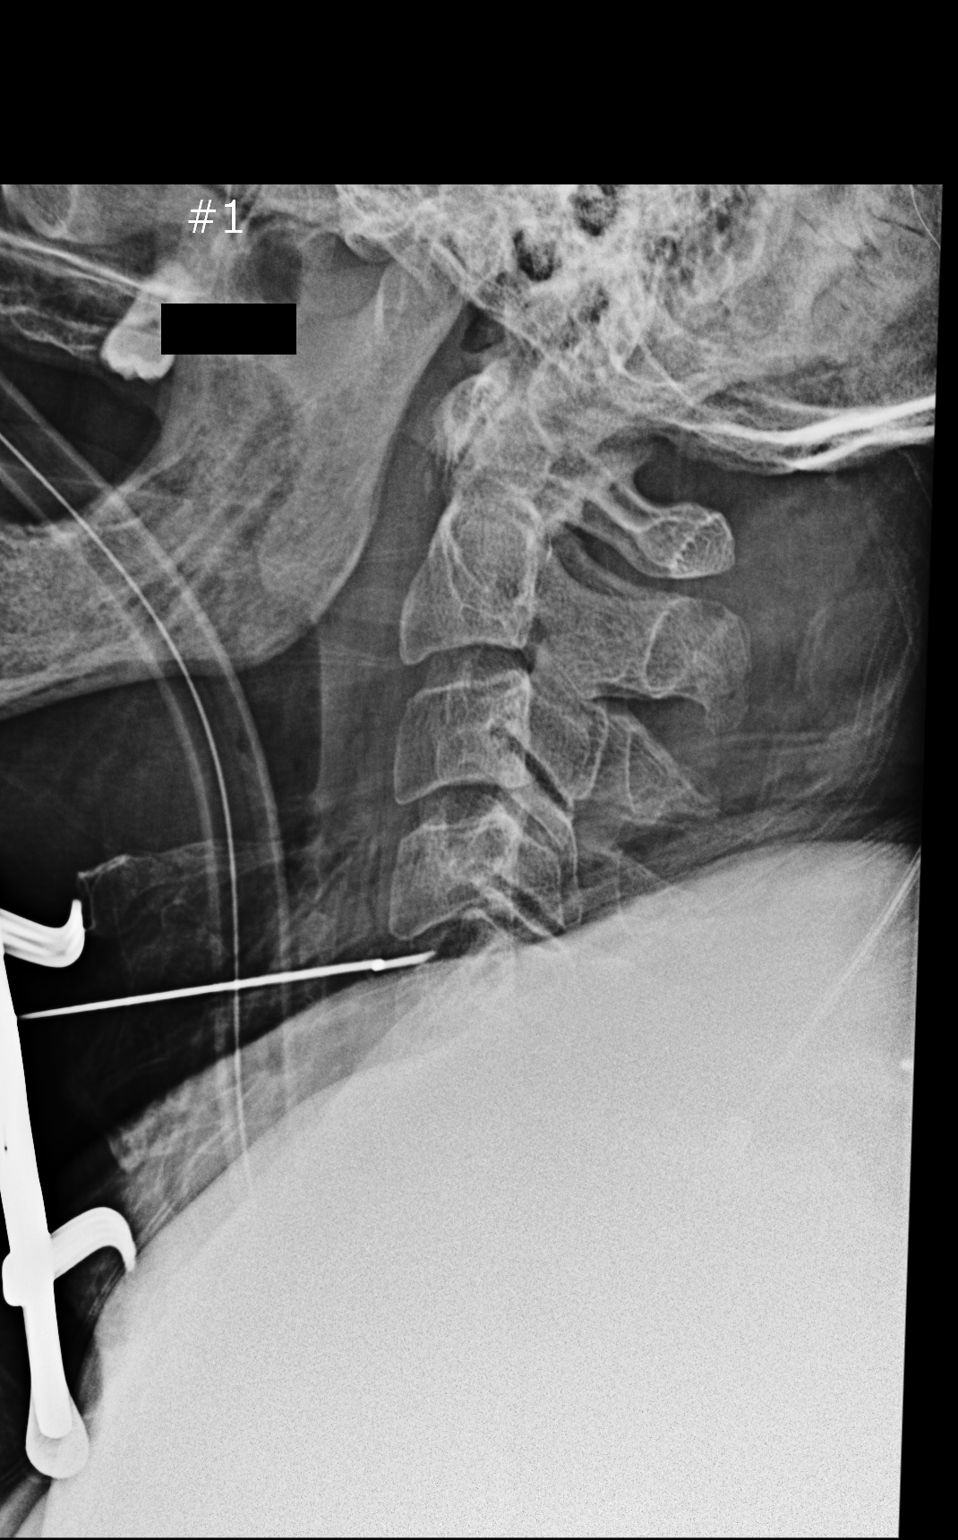

[1 of 1 positions shown; findings below may reference images not displayed]

FINDINGS: Submitted lateral images initially show placement of a surgical
probe with its tip superimposed over the anterior aspect of the
C4-C5 disc. Followup image shows placement of a fusion plate and
fixation screws spanning C5-C6 with the lower aspect not well
visualized due to overlying soft tissues. There is no acute fracture
or evidence of an operative complication.
IMPRESSION: Localization imaging for cervical spine fusion as described.

## 2016-06-02 ENCOUNTER — Encounter: Payer: BLUE CROSS/BLUE SHIELD | Attending: Physical Medicine & Rehabilitation

## 2016-06-02 ENCOUNTER — Ambulatory Visit (HOSPITAL_BASED_OUTPATIENT_CLINIC_OR_DEPARTMENT_OTHER): Payer: BLUE CROSS/BLUE SHIELD | Admitting: Physical Medicine & Rehabilitation

## 2016-06-02 ENCOUNTER — Encounter: Payer: Self-pay | Admitting: Physical Medicine & Rehabilitation

## 2016-06-02 VITALS — BP 131/91 | HR 88

## 2016-06-02 DIAGNOSIS — R51 Headache: Secondary | ICD-10-CM | POA: Diagnosis present

## 2016-06-02 DIAGNOSIS — Z5181 Encounter for therapeutic drug level monitoring: Secondary | ICD-10-CM

## 2016-06-02 DIAGNOSIS — M4316 Spondylolisthesis, lumbar region: Secondary | ICD-10-CM | POA: Diagnosis not present

## 2016-06-02 DIAGNOSIS — K219 Gastro-esophageal reflux disease without esophagitis: Secondary | ICD-10-CM | POA: Diagnosis not present

## 2016-06-02 DIAGNOSIS — Z981 Arthrodesis status: Secondary | ICD-10-CM | POA: Diagnosis not present

## 2016-06-02 DIAGNOSIS — M5126 Other intervertebral disc displacement, lumbar region: Secondary | ICD-10-CM | POA: Diagnosis not present

## 2016-06-02 DIAGNOSIS — M47812 Spondylosis without myelopathy or radiculopathy, cervical region: Secondary | ICD-10-CM

## 2016-06-02 DIAGNOSIS — Z8673 Personal history of transient ischemic attack (TIA), and cerebral infarction without residual deficits: Secondary | ICD-10-CM | POA: Diagnosis not present

## 2016-06-02 DIAGNOSIS — F17210 Nicotine dependence, cigarettes, uncomplicated: Secondary | ICD-10-CM | POA: Diagnosis not present

## 2016-06-02 DIAGNOSIS — M542 Cervicalgia: Secondary | ICD-10-CM | POA: Diagnosis present

## 2016-06-02 DIAGNOSIS — F329 Major depressive disorder, single episode, unspecified: Secondary | ICD-10-CM | POA: Insufficient documentation

## 2016-06-02 DIAGNOSIS — Z79899 Other long term (current) drug therapy: Secondary | ICD-10-CM

## 2016-06-02 DIAGNOSIS — M5030 Other cervical disc degeneration, unspecified cervical region: Secondary | ICD-10-CM | POA: Diagnosis not present

## 2016-06-02 DIAGNOSIS — M961 Postlaminectomy syndrome, not elsewhere classified: Secondary | ICD-10-CM | POA: Diagnosis not present

## 2016-06-02 MED ORDER — BACLOFEN 10 MG PO TABS: 5.0000 mg | ORAL_TABLET | Freq: Three times a day (TID) | ORAL | 1 refills | Status: DC

## 2016-06-02 NOTE — Patient Instructions (Signed)
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 That is a condition that may respond to Botox Have started baclofen for muscle relaxer. Should not cause drowsiness but would not take it prior to driving or working until you try at home

## 2016-06-02 NOTE — Progress Notes (Signed)
Subjective:    Patient ID: Cody Knight, male    DOB: [birth year], 51 y.o.   MRN: [id number]  HPI Primary complaint neck pain and headaches 51 year old male with a greater than 9 year history of neck pain as well as headache pain. Patient has midline neck pain. He feels it radiates to the posterior scalp, as well as the temporal area bilaterally. Patient had shoulder pain prior to his cervical spine surgery. He's had some occasionally since the surgery but not as often He has been evaluated by neurology in the past. Dr. Blenda Nicely saw him at no one to in August 2016. Neuro exam at that time was negative He was trialed on nortriptyline. He was tapered off gabapentin. He has tried numerous other medications including Fioricet, cyclobenzaprine, Valium, Paxil, as well as prednisone tapers. No known medications that the patient is tried in the past has been helpful. Patient has history of C5-C6 ACDF by Dr. Arnoldo Morale in March 2016. Patient plans to see Dr. Arnoldo Morale, every 6 month follow-up.  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  Prior history also significant for left parietal CVA Past surgical history also positive for bilateral carotid endarterectomies. Has had no physical therapy.  Neither of these was reported to be helpful. Has not tried physical therapy.    Pain Inventory Average Pain 10 Pain Right Now 10 My pain is constant and sharp  In the last 24 hours, has pain interfered with the following? General activity 10 Relation with others 10 Enjoyment of life 10 What TIME of day is your pain at its worst? morning Sleep (in general) Fair  Pain is worse with: . Pain improves with: . Relief from Meds: .  Mobility walk without assistance ability to climb steps?  yes do you drive?  no  Function employed # of hrs/week  .  Neuro/Psych weakness confusion  Prior Studies Any changes since last visit?  no PROCEDURE: LUMBAR PUNCTURE FOR CERVICAL AND LUMBAR MYELOGRAM  CERVICAL AND LUMBAR MYELOGRAM  CT CERVICAL MYELOGRAM  CT LUMBAR MYELOGRAM  After thorough discussion of risks and benefits of the procedure including bleeding, infection, injury to nerves, blood vessels, adjacent structures as well as headache and CSF leak, written and oral informed consent was obtained. Consent was obtained by Dr. Logan Bores.  Patient was positioned prone on the fluoroscopy table. Local anesthesia was provided with 1% lidocaine without epinephrine after prepped and draped in the usual sterile fashion. Puncture was performed at L4-5 using a 3 1/2 inch 22-gauge spinal needle via a right paramedian approach. Using a single pass through the dura, the needle was placed within the thecal sac, with return of clear CSF. 10 mL of Omnipaque-300 was injected into the thecal sac, with normal opacification of the nerve roots and cauda equina consistent with free flow within the subarachnoid space. The patient was then moved to the trendelenburg position and contrast flowed into the Cervical spine region.  I personally performed the lumbar puncture and administered the intrathecal contrast. I also personally supervised acquisition of the myelogram images.  TECHNIQUE: Contiguous axial images were obtained through the Cervical and Lumbar spine after the intrathecal infusion of contrast. Coronal and sagittal reconstructions were obtained of the axial image sets.  COMPARISON:  Lumbar spine MRI 11/16/2014. Cervical spine MRI 10/11/2013.  FINDINGS: CERVICAL AND LUMBAR MYELOGRAM FINDINGS:  There is mild reversal of the normal cervical lordosis.  There is no significant listhesis with neutral, flexion, or extension positioning. Sequelae of interval C5-6 ACDF are identified. There is a small ventral extradural defect  at C3-4 with endplate spurring. No significant spinal stenosis is seen.  Slight retrolisthesis of L4 on L5 and L5 on S1 do not significantly change with flexion or extension. Vertebral body heights are preserved. Moderate disc space narrowing is present at L5-S1. Disc space heights are preserved elsewhere in the lumbar spine with widely patent spinal canal.  CT CERVICAL MYELOGRAM FINDINGS:  There is slight reversal of the normal cervical lordosis. There is at most trace anterolisthesis of C6 on C7. Vertebral body heights are preserved. Sequelae of interval C5-6 ACDF are identified. There is solid osseous fusion at the anterior aspect of the disc space. The cervical spinal cord is normal in caliber. Mild atherosclerotic calcification is noted at the left carotid bifurcation.  C2-3:  Negative.  C3-4: Minimal disc bulging and right greater than left uncovertebral spurring result in moderate right and mild left neural foraminal stenosis, unchanged. No spinal stenosis.  C4-5: Minimal uncovertebral spurring and right facet arthrosis without significant stenosis, unchanged.  C5-6: Interval ACDF. No residual spinal stenosis. Mild-to-moderate right and mild left residual neural foraminal stenosis, improved from prior.  C6-7:  Negative.  C7-T1:  Negative.  CT LUMBAR MYELOGRAM FINDINGS:  Slight retrolisthesis is again seen of L4 on L5 and L5 on S1, unchanged from the prior MRI. Vertebral body heights are preserved. There is moderate disc space narrowing at L5-S1 with vacuum disc phenomenon and endplate sclerosis and osteophytosis. Intervertebral disc space heights are preserved elsewhere. Conus medullaris terminates at L1. Mild aortoiliac atherosclerotic calcification is noted.  L1-2 through L4-5:  Negative.  L5-S1: Circumferential disc bulging, endplate spurring, disc space height loss, and mild facet arthrosis result in moderate left greater than right neural  foraminal stenosis, unchanged. Small, broad central disc protrusion appears to contact the right S1 nerve root and approaches but does not clearly contact the left S1 nerve root. No S1 nerve root impingement is seen.  IMPRESSION: 1. Interval C5-6 ACDF with solid fusion and no residual spinal stenosis. Improved, mild-to-moderate right and mild left neural foraminal stenosis. 2. Mild disc degeneration at C3-4 with unchanged, moderate right and mild left neural foraminal stenosis. No spinal stenosis. 3. L5-S1 disc degeneration resulting in moderate bilateral neural foraminal stenosis. Small central disc protrusion at this level without evidence of S1 nerve root impingement. 4. Otherwise unremarkable appearance of the lumbar spine.   Electronically Signed   By: Logan Bores M.D.   On: 02/05/2015 12:07 Physicians involved in your care Any changes since last visit?  no   Family History  Problem Relation Age of Onset  . Hypertension Mother   . Diabetes Mother    Social History   Social History  . Marital status: Married    Spouse name: N/A  . Number of children: N/A  . Years of education: N/A   Social History Main Topics  . Smoking status: Current Every Day Smoker    Packs/day: 1.00    Years: 20.00    Types: Cigarettes  . Smokeless tobacco: Never Used  . Alcohol use No  . Drug use:     Types: Marijuana     Comment: last  week  . Sexual activity: Not Asked   Other Topics Concern  . None   Social History Narrative  . None   Past Surgical History:  Procedure Laterality Date  . ANTERIOR CERVICAL DECOMP/DISCECTOMY  FUSION N/A 08/02/2014   Procedure: CERVICAL FIVE-SIX ANTERIOR CERVICAL DECOMPRESSION WITH FUSION INTERBODY PROSTHESIS PLATING AND BONEGRAFT.;  Surgeon: Newman Pies, MD;  Location: [facility];  Service: Neurosurgery;  Laterality: N/A;  . SHOULDER SURGERY     right shoulder arthroscopy   Past Medical History:  Diagnosis Date  . Back problem   .  Chest pain    in hospital with stroke  . Cyst    left breast  . Depression   . GERD (gastroesophageal reflux disease)    occ  . Headache   . Sinus bradycardia on ECG    in hospital with stroke  . Stroke ([place]) 10/15   slight weakness in hands per patient   There were no vitals taken for this visit.  Opioid Risk Score:   Fall Risk Score:  `1  Depression screen PHQ 2/9  No flowsheet data found. Review of Systems  HENT: Negative.   Eyes: Negative.   Respiratory: Negative.   Cardiovascular: Negative.   Gastrointestinal: Positive for nausea.  Endocrine: Negative.   Genitourinary: Negative.   Musculoskeletal: Negative.   Skin: Negative.   Allergic/Immunologic: Negative.   Neurological: Positive for weakness.  Hematological: Negative.   Psychiatric/Behavioral: Positive for confusion.  All other systems reviewed and are negative.      Objective:   Physical Exam  Constitutional: He appears well-developed and well-nourished. No distress.  HENT:  Head: Normocephalic and atraumatic.  Eyes: Conjunctivae and EOM are normal. Pupils are equal, round, and reactive to light.  Visual fields are intact. Competition testing  Neck:  Cervical spine has 50% flexion 0-25%, extension, 25%, lateral bending, 50% right rotation 25%, left rotation  Healed. Bilateral CEA incisions just anterior to sternocleidomastoid, healed left transverse ACDF incision  No tenderness over the posterior cervical paraspinal muscles. No numbness. With pinprick examination over the posterior scalp area  Cardiovascular: Normal rate and regular rhythm.   Pulmonary/Chest: Effort normal and breath sounds normal. No respiratory distress. He has no wheezes.  Abdominal: Soft. Bowel sounds are normal. He exhibits no distension. There is no tenderness.  Musculoskeletal:  Normal upper extremity and lower extremity range of motion.  Neurological: He is alert.  Psychiatric: Judgment and thought content normal. His  affect is blunt. His speech is delayed. He is slowed. He exhibits abnormal recent memory.  Nursing note and vitals reviewed.   Neuro:  Eyes without evidence of nystagmus  Tone is normal without evidence of spasticity Cerebellar exam shows no evidence of ataxia on finger nose finger or heel to shin testing No evidence of trunkal ataxia  Motor strength is 5/5 in bilateral deltoid, biceps, triceps, finger flexors and extensors, wrist flexors and extensors, hip flexors, knee flexors and extensors, ankle dorsiflexors, plantar flexors, invertors and evertors, toe flexors and extensors  Sensory exam is normal to pinprick, proprioception and light touch in the upper and lower limbs   Cranial nerves II- Visual fields are intact to confrontation testing, no blurring of vision III- no evidence of ptosis, upward, downward and medial gaze intact IV- no vertical diplopia or head tilt V- no facial numbness or masseter weakness VI- no pupil abduction weakness VII- no facial droop, good lid closure VII- normal auditory acuity IX- no pharygeal weakness, gag nl X- no pharyngeal weakness, no hoarseness XI- no trap or SCM weakness, reduced range of motion XII- no glossal weakness  Mild left lateral Collis as well as torticollis      Assessment & Plan:  1. Cervical pain which radiates  into the occipital and temporal areas. He has had extensive neurosurgical and neurological workup. He  trialed numerous headache prophylactic medications. He has tried epidural steroid injections. Review of imaging studies, most recently, CT myelogram of the cervical spine in 2016 demonstrating intact hardware with adequate decompression at C5-C6, some spondylosis noted at C3-4, C4-5 This likely is a contributory factor to his pain.  Neuro exam shows no evidence of focal neurologic deficits  Given his degree of range of motion, would recommend physical therapy. No restrictions.  If this is not helpful, consider  cervical medial branch blocks at C4-C5, would do unilaterally and if helpful, then progressed to bilateral  2. 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 Would avoid narcotic analgesics given that he works in [employer]. At this point, we have not exhausted other treatment options.  3. 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 He has no evidence of field cut, motor or sensory deficit, as residual. He may have some mild cognitive deficits that he has reported. These appear to be mainly short-term memory. Example includes walking into a room to get something and then forgetting what he came for.

## 2016-06-07 LAB — TOXASSURE SELECT,+ANTIDEPR,UR

## 2016-06-08 ENCOUNTER — Telehealth: Payer: Self-pay

## 2016-06-08 ENCOUNTER — Encounter: Payer: Self-pay | Admitting: Physical Medicine & Rehabilitation

## 2016-06-08 NOTE — Progress Notes (Signed)
Patient Urine drug screen came back Inconsistent. Urine sample contained Carboxy-THC, which indicates the presences of marijuana. Please See lab report for details and advise.  

## 2016-06-08 NOTE — Telephone Encounter (Signed)
Patient Urine drug screen came back Inconsistent. Urine sample contained Carboxy-THC, which indicates the presences of marijuana. Please See lab report for details and advise.  

## 2016-06-08 NOTE — Telephone Encounter (Signed)
Please inform patient, nonnarcotic treatment only

## 2016-06-10 NOTE — Telephone Encounter (Signed)
Contacted patient. He expressed disappointment.  He said he smoked just a little to deal with his pain.  He said he would give it up. He said he doesn't understand what the big deal is.  I told him Dr. Letta Pate would treat him on a non-narcotic basis. He said the shots to help him at all.  I asked if he wanted meet to cancel the appointment.  He said go right ahead (and cancel the appointment).  Appointment cancelled.

## 2016-06-12 ENCOUNTER — Other Ambulatory Visit: Payer: Self-pay | Admitting: *Deleted

## 2016-06-12 MED ORDER — BACLOFEN 10 MG PO TABS: 10.0000 mg | ORAL_TABLET | Freq: Three times a day (TID) | ORAL | 1 refills | Status: DC

## 2016-06-23 ENCOUNTER — Ambulatory Visit: Payer: BLUE CROSS/BLUE SHIELD | Admitting: Physical Medicine & Rehabilitation

## 2016-07-01 ENCOUNTER — Other Ambulatory Visit: Payer: Self-pay | Admitting: Neurosurgery

## 2016-07-01 DIAGNOSIS — M542 Cervicalgia: Secondary | ICD-10-CM

## 2016-07-10 ENCOUNTER — Ambulatory Visit
Admission: RE | Admit: 2016-07-10 | Discharge: 2016-07-10 | Disposition: A | Discharge status: Home / Self Care | Payer: BLUE CROSS/BLUE SHIELD | Source: Ambulatory Visit | Attending: Neurosurgery | Admitting: Neurosurgery

## 2016-07-10 DIAGNOSIS — M542 Cervicalgia: Secondary | ICD-10-CM

## 2016-11-23 ENCOUNTER — Other Ambulatory Visit: Payer: Self-pay | Admitting: Physical Medicine & Rehabilitation

## 2017-05-11 ENCOUNTER — Other Ambulatory Visit: Payer: Self-pay | Admitting: Physical Medicine & Rehabilitation

## 2019-04-24 ENCOUNTER — Other Ambulatory Visit: Payer: Self-pay

## 2019-04-24 DIAGNOSIS — Z20822 Contact with and (suspected) exposure to covid-19: Secondary | ICD-10-CM

## 2019-04-26 LAB — NOVEL CORONAVIRUS, NAA: SARS-CoV-2, NAA: NOT DETECTED

## 2020-01-02 ENCOUNTER — Encounter (HOSPITAL_BASED_OUTPATIENT_CLINIC_OR_DEPARTMENT_OTHER): Payer: Self-pay | Admitting: *Deleted

## 2020-01-02 ENCOUNTER — Other Ambulatory Visit: Payer: Self-pay

## 2020-01-02 ENCOUNTER — Emergency Department (HOSPITAL_BASED_OUTPATIENT_CLINIC_OR_DEPARTMENT_OTHER)
Admission: EM | Admit: 2020-01-02 | Discharge: 2020-01-03 | Disposition: A | Discharge status: Home / Self Care | Payer: 59 | Attending: Emergency Medicine | Admitting: Emergency Medicine

## 2020-01-02 DIAGNOSIS — R109 Unspecified abdominal pain: Secondary | ICD-10-CM | POA: Diagnosis present

## 2020-01-02 DIAGNOSIS — F17210 Nicotine dependence, cigarettes, uncomplicated: Secondary | ICD-10-CM | POA: Insufficient documentation

## 2020-01-02 DIAGNOSIS — R10A2 Flank pain, left side: Secondary | ICD-10-CM

## 2020-01-02 DIAGNOSIS — K769 Liver disease, unspecified: Secondary | ICD-10-CM

## 2020-01-02 LAB — CBC WITH DIFFERENTIAL/PLATELET
Abs Immature Granulocytes: 0.02 10*3/uL (ref 0.00–0.07)
Basophils Absolute: 0.1 10*3/uL (ref 0.0–0.1)
Basophils Relative: 1 %
Eosinophils Absolute: 0.1 10*3/uL (ref 0.0–0.5)
Eosinophils Relative: 1 %
HCT: 41.8 % (ref 39.0–52.0)
Hemoglobin: 14 g/dL (ref 13.0–17.0)
Immature Granulocytes: 0 %
Lymphocytes Relative: 31 %
Lymphs Abs: 2.5 10*3/uL (ref 0.7–4.0)
MCH: 32.3 pg (ref 26.0–34.0)
MCHC: 33.5 g/dL (ref 30.0–36.0)
MCV: 96.5 fL (ref 80.0–100.0)
Monocytes Absolute: 0.5 10*3/uL (ref 0.1–1.0)
Monocytes Relative: 7 %
Neutro Abs: 4.8 10*3/uL (ref 1.7–7.7)
Neutrophils Relative %: 60 %
Platelets: 239 10*3/uL (ref 150–400)
RBC: 4.33 MIL/uL (ref 4.22–5.81)
RDW: 13.8 % (ref 11.5–15.5)
WBC: 8 10*3/uL (ref 4.0–10.5)
nRBC: 0 % (ref 0.0–0.2)

## 2020-01-02 LAB — COMPREHENSIVE METABOLIC PANEL
ALT: 26 U/L (ref 0–44)
AST: 20 U/L (ref 15–41)
Albumin: 3.7 g/dL (ref 3.5–5.0)
Alkaline Phosphatase: 66 U/L (ref 38–126)
Anion gap: 11 (ref 5–15)
BUN: 14 mg/dL (ref 6–20)
CO2: 25 mmol/L (ref 22–32)
Calcium: 9 mg/dL (ref 8.9–10.3)
Chloride: 107 mmol/L (ref 98–111)
Creatinine, Ser: 1.16 mg/dL (ref 0.61–1.24)
GFR calc Af Amer: >60 mL/min (ref >60)
GFR calc non Af Amer: >60 mL/min (ref >60)
Glucose, Bld: 113 mg/dL — ABNORMAL HIGH (ref 70–99)
Potassium: 3.8 mmol/L (ref 3.5–5.1)
Sodium: 143 mmol/L (ref 135–145)
Total Bilirubin: 0.1 mg/dL — ABNORMAL LOW (ref 0.3–1.2)
Total Protein: 6.7 g/dL (ref 6.5–8.1)

## 2020-01-02 LAB — URINALYSIS, ROUTINE W REFLEX MICROSCOPIC
Bilirubin Urine: NEGATIVE
Glucose, UA: NEGATIVE mg/dL
Hgb urine dipstick: NEGATIVE
Ketones, ur: NEGATIVE mg/dL
Leukocytes,Ua: NEGATIVE
Nitrite: NEGATIVE
Protein, ur: NEGATIVE mg/dL
Specific Gravity, Urine: 1.025 (ref 1.005–1.030)
pH: 6.5 (ref 5.0–8.0)

## 2020-01-02 LAB — LIPASE, BLOOD: Lipase: 23 U/L (ref 11–51)

## 2020-01-02 NOTE — ED Triage Notes (Signed)
Pt c/o left flank pain and left back pain x 3 days

## 2020-01-03 ENCOUNTER — Emergency Department (HOSPITAL_BASED_OUTPATIENT_CLINIC_OR_DEPARTMENT_OTHER): Payer: 59

## 2020-01-03 MED ORDER — BACLOFEN 10 MG PO TABS: 10.0000 mg | ORAL_TABLET | Freq: Three times a day (TID) | ORAL | 0 refills | Status: DC | PRN

## 2020-01-03 MED ORDER — NAPROXEN 250 MG PO TABS
500.0000 mg | ORAL_TABLET | Freq: Once | ORAL | Status: AC
  Administered 2020-01-03: 500 mg via ORAL
  Filled 2020-01-03: qty 2

## 2020-01-03 MED ORDER — NAPROXEN 375 MG PO TABS: ORAL_TABLET | ORAL | 0 refills | Status: DC

## 2020-01-03 NOTE — ED Provider Notes (Signed)
[facility] Provider Note: Cody Spurling, MD, FACEP  CSN: [id number] MRN: [id number] ARRIVAL: 01/02/20 at 2148 ROOM: [place]  Flank Pain   HISTORY OF PRESENT ILLNESS  01/03/20 12:36 AM Cody Knight is a 54 y.o. male with left flank pain radiating around to his left upper abdomen for about the past 4 days.  He rates the pain is a 7 out of 10.  He states it is worse when he was working but it is not significantly changed with palpation or minor movements.  He denies inciting injury.  He has had no urinary changes.  There is no associated rash.   Past Medical History:  Diagnosis Date  . Back problem   . Chest pain    in hospital with stroke  . Cyst    left breast  . Depression   . GERD (gastroesophageal reflux disease)    occ  . Headache   . Sinus bradycardia on ECG    in hospital with stroke  . Stroke ([place]) 10/15   slight weakness in hands per patient    Past Surgical History:  Procedure Laterality Date  . ANTERIOR CERVICAL DECOMP/DISCECTOMY FUSION N/A 08/02/2014   Procedure: CERVICAL FIVE-SIX ANTERIOR CERVICAL DECOMPRESSION WITH FUSION INTERBODY PROSTHESIS PLATING AND BONEGRAFT.;  Surgeon: Newman Pies, MD;  Location: [facility];  Service: Neurosurgery;  Laterality: N/A;  . SHOULDER SURGERY     right shoulder arthroscopy    Family History  Problem Relation Age of Onset  . Hypertension Mother   . Diabetes Mother     Social History   Tobacco Use  . Smoking status: Current Every Day Smoker    Packs/day: 1.00    Years: 20.00    Pack years: 20.00    Types: Cigarettes  . Smokeless tobacco: Never Used  Substance Use Topics  . Alcohol use: No  . Drug use: Yes    Types: Marijuana    Comment: last  week    Prior to Admission medications   Medication Sig Start Date End Date Taking? Authorizing Provider  aspirin EC 81 MG tablet Take 81 mg by mouth daily.    [provider]  baclofen (LIORESAL) 10 MG tablet Take 1  tablet (10 mg total) by mouth 3 (three) times daily as needed for muscle spasms. 01/03/20   Deshannon Hinchliffe, [place], MD  divalproex (DEPAKOTE) 250 MG DR tablet Take 500 mg by mouth 2 (two) times daily.    [provider]  gabapentin (NEURONTIN) 300 MG capsule Take 1 capsule (300 mg total) by mouth 3 (three) times daily. 07/01/15   Newman Pies, MD  naproxen (NAPROSYN) 375 MG tablet Take 1 tablet twice daily as needed for back pain. 01/03/20   Aidynn Krenn, Oluwafemi, MD  PARoxetine (PAXIL) 30 MG tablet Take 30 mg by mouth daily. 06/10/15   [provider]  rosuvastatin (CRESTOR) 20 MG tablet Take 20 mg by mouth daily.    [provider]    Allergies Atorvastatin and Cephalexin   REVIEW OF SYSTEMS  Negative except as noted here or in the History of Present Illness.   PHYSICAL EXAMINATION  Initial Vital Signs Blood pressure 132/68, pulse 62, temperature 98.2 F (36.8 C), temperature source Oral, resp. rate 20, height 5\' 9"  (1.753 m), weight 81.6 kg, SpO2 99 %.  Examination General: Well-developed, well-nourished male in no acute distress; appearance consistent with age of record HENT: normocephalic; atraumatic Eyes: pupils equal, round and reactive to light; extraocular muscles intact  Neck: supple Heart: regular rate and rhythm Lungs: clear to auscultation bilaterally Abdomen: soft; nondistended; nontender; no masses or hepatosplenomegaly; bowel sounds present GU: No CVA tenderness Extremities: No deformity; full range of motion; pulses normal Neurologic: Sleeping but readily awakened; motor function intact in all extremities and symmetric; no facial droop Skin: Warm and dry; no rash or hyperesthesia/allodynia at site of reported pain Psychiatric: Flat affect   RESULTS  Summary of this visit's results, reviewed and interpreted by myself:   EKG Interpretation  Date/Time:    Ventricular Rate:    PR Interval:    QRS Duration:   QT Interval:    QTC Calculation:   R Axis:      Text Interpretation:        Laboratory Studies: Results for orders placed or performed during the hospital encounter of 01/02/20 (from the past 24 hour(s))  CBC with Differential     Status: None   Collection Time: 01/02/20 10:03 PM  Result Value Ref Range   WBC 8.0 4.0 - 10.5 K/uL   RBC 4.33 4.22 - 5.81 MIL/uL   Hemoglobin 14.0 13.0 - 17.0 g/dL   HCT 41.8 39 - 52 %   MCV 96.5 80.0 - 100.0 fL   MCH 32.3 26.0 - 34.0 pg   MCHC 33.5 30.0 - 36.0 g/dL   RDW 13.8 11.5 - 15.5 %   Platelets 239 150 - 400 K/uL   nRBC 0.0 0.0 - 0.2 %   Neutrophils Relative % 60 %   Neutro Abs 4.8 1.7 - 7.7 K/uL   Lymphocytes Relative 31 %   Lymphs Abs 2.5 0.7 - 4.0 K/uL   Monocytes Relative 7 %   Monocytes Absolute 0.5 0 - 1 K/uL   Eosinophils Relative 1 %   Eosinophils Absolute 0.1 0 - 0 K/uL   Basophils Relative 1 %   Basophils Absolute 0.1 0 - 0 K/uL   Immature Granulocytes 0 %   Abs Immature Granulocytes 0.02 0.00 - 0.07 K/uL  Comprehensive metabolic panel     Status: Abnormal   Collection Time: 01/02/20 10:03 PM  Result Value Ref Range   Sodium 143 135 - 145 mmol/L   Potassium 3.8 3.5 - 5.1 mmol/L   Chloride 107 98 - 111 mmol/L   CO2 25 22 - 32 mmol/L   Glucose, Bld 113 (H) 70 - 99 mg/dL   BUN 14 6 - 20 mg/dL   Creatinine, Ser 1.16 0.61 - 1.24 mg/dL   Calcium 9.0 8.9 - 10.3 mg/dL   Total Protein 6.7 6.5 - 8.1 g/dL   Albumin 3.7 3.5 - 5.0 g/dL   AST 20 15 - 41 U/L   ALT 26 0 - 44 U/L   Alkaline Phosphatase 66 38 - 126 U/L   Total Bilirubin 0.1 (L) 0.3 - 1.2 mg/dL   GFR calc non Af Amer >60 >60 mL/min   GFR calc Af Amer >60 >60 mL/min   Anion gap 11 5 - 15  Lipase, blood     Status: None   Collection Time: 01/02/20 10:03 PM  Result Value Ref Range   Lipase 23 11 - 51 U/L  Urinalysis, Routine w reflex microscopic     Status: None   Collection Time: 01/02/20 11:25 PM  Result Value Ref Range   Color, Urine YELLOW YELLOW   APPearance CLEAR CLEAR   Specific Gravity, Urine 1.025 1.005  - 1.030   pH 6.5 5.0 - 8.0   Glucose, UA NEGATIVE NEGATIVE mg/dL  Hgb urine dipstick NEGATIVE NEGATIVE   Bilirubin Urine NEGATIVE NEGATIVE   Ketones, ur NEGATIVE NEGATIVE mg/dL   Protein, ur NEGATIVE NEGATIVE mg/dL   Nitrite NEGATIVE NEGATIVE   Leukocytes,Ua NEGATIVE NEGATIVE   Imaging Studies: CT Renal Stone Study  Result Date: 01/03/2020 CLINICAL DATA:  Left flank pain and left back pain for 3 days. Suspected kidney stone EXAM: CT ABDOMEN AND PELVIS WITHOUT CONTRAST TECHNIQUE: Multidetector CT imaging of the abdomen and pelvis was performed following the standard protocol without IV contrast. COMPARISON:  None. FINDINGS: Lower chest: The lung bases are clear. Hepatobiliary: There is a vague suggestion of focal low-attenuation lesions in the right lobe of the liver. Visualization is limited due to lack of IV contrast material. Suggest ultrasound of the liver or contrast-enhanced CT to exclude focal liver lesions. The gallbladder is contracted. No bile duct dilatation. Pancreas: Unremarkable. No pancreatic ductal dilatation or surrounding inflammatory changes. Spleen: Normal in size without focal abnormality. Adrenals/Urinary Tract: Adrenal glands are unremarkable. Kidneys are normal, without renal calculi, focal lesion, or hydronephrosis. Bladder is unremarkable. Stomach/Bowel: Stomach is within normal limits. Appendix appears normal. No evidence of bowel wall thickening, distention, or inflammatory changes. Vascular/Lymphatic: Aortic atherosclerosis. No enlarged abdominal or pelvic lymph nodes. Reproductive: Prostate is unremarkable. Other: No abdominal wall hernia or abnormality. No abdominopelvic ascites. Musculoskeletal: Posterior fixation and intervertebral disc prosthesis at L5-S1. No destructive bone lesions. IMPRESSION: 1. No renal or ureteral stone or obstruction. 2. Vague suggestion of focal low-attenuation lesions in the right lobe of the liver. Suggest ultrasound of the liver or  contrast-enhanced CT to exclude focal liver lesions. 3. Aortic atherosclerosis. Aortic Atherosclerosis (ICD10-I70.0). Electronically Signed   By: Lucienne Capers M.D.   On: 01/03/2020 01:04    ED COURSE and MDM  Nursing notes, initial and subsequent vitals signs, including pulse oximetry, reviewed and interpreted by myself.  Vitals:   01/02/20 2153 01/03/20 0022  BP: (!) 150/88 132/68  Pulse: (!) 50 62  Resp: 18 20  Temp: 98.4 F (36.9 C) 98.2 F (36.8 C)  TempSrc: Oral Oral  SpO2: 97% 99%  Weight: 81.6 kg   Height: 5\' 9"  (1.753 m)    Medications  naproxen (NAPROSYN) tablet 500 mg (has no administration in time range)    I suspect the patient's flank pain is musculoskeletal we will treat with NSAIDs and a muscle relaxant.  We will have him follow-up with his PCP regarding liver follow-up.   PROCEDURES  Procedures   ED DIAGNOSES     ICD-10-CM   1. Left flank pain  R10.9   2. Liver lesion, right lobe  K76.9        Leydy Worthey, Jenny Reichmann, MD 01/03/20 [phone]

## 2020-01-03 NOTE — Discharge Instructions (Signed)
Your CT scan suggested lesions of the liver.  The radiologist suggested follow-up ultrasound or contrast enhanced CT scan to further evaluate these lesions.  You may arrange this through your primary care physician.

## 2020-08-19 ENCOUNTER — Other Ambulatory Visit: Payer: Self-pay

## 2020-08-19 ENCOUNTER — Emergency Department (HOSPITAL_BASED_OUTPATIENT_CLINIC_OR_DEPARTMENT_OTHER)
Admission: EM | Admit: 2020-08-19 | Discharge: 2020-08-19 | Disposition: A | Discharge status: Home / Self Care | Payer: 59 | Attending: Emergency Medicine | Admitting: Emergency Medicine

## 2020-08-19 ENCOUNTER — Encounter (HOSPITAL_BASED_OUTPATIENT_CLINIC_OR_DEPARTMENT_OTHER): Payer: Self-pay

## 2020-08-19 DIAGNOSIS — Z7982 Long term (current) use of aspirin: Secondary | ICD-10-CM | POA: Diagnosis not present

## 2020-08-19 DIAGNOSIS — S39012A Strain of muscle, fascia and tendon of lower back, initial encounter: Secondary | ICD-10-CM | POA: Diagnosis not present

## 2020-08-19 DIAGNOSIS — F17210 Nicotine dependence, cigarettes, uncomplicated: Secondary | ICD-10-CM | POA: Insufficient documentation

## 2020-08-19 DIAGNOSIS — X500XXA Overexertion from strenuous movement or load, initial encounter: Secondary | ICD-10-CM | POA: Diagnosis not present

## 2020-08-19 DIAGNOSIS — S3992XA Unspecified injury of lower back, initial encounter: Secondary | ICD-10-CM | POA: Diagnosis present

## 2020-08-19 MED ORDER — ORPHENADRINE CITRATE ER 100 MG PO TB12: 100.0000 mg | ORAL_TABLET | Freq: Two times a day (BID) | ORAL | 0 refills | Status: DC

## 2020-08-19 MED ORDER — METHYLPREDNISOLONE ACETATE 80 MG/ML IJ SUSP
80.0000 mg | Freq: Once | INTRAMUSCULAR | Status: AC
  Administered 2020-08-19: 80 mg via INTRAMUSCULAR
  Filled 2020-08-19: qty 1

## 2020-08-19 MED ORDER — CYCLOBENZAPRINE HCL 10 MG PO TABS
10.0000 mg | ORAL_TABLET | Freq: Once | ORAL | Status: AC
  Administered 2020-08-19: 10 mg via ORAL
  Filled 2020-08-19: qty 1

## 2020-08-19 MED ORDER — MELOXICAM 7.5 MG PO TABS: 7.5000 mg | ORAL_TABLET | Freq: Every day | ORAL | 0 refills | Status: DC

## 2020-08-19 MED ORDER — KETOROLAC TROMETHAMINE 30 MG/ML IJ SOLN
30.0000 mg | Freq: Once | INTRAMUSCULAR | Status: AC
  Administered 2020-08-19: 30 mg via INTRAMUSCULAR
  Filled 2020-08-19: qty 1

## 2020-08-19 NOTE — ED Triage Notes (Signed)
Pt presents with complaints of lower back pain x 2 days. States pain started after he was moving furniture.

## 2020-08-19 NOTE — Discharge Instructions (Addendum)
Take Norflex and meloxicam as prescribed.  Warm compresses to sore muscles for 20 minutes at a time.  Follow-up with your doctor for recheck and possible referral for physical therapy.

## 2020-08-19 NOTE — ED Provider Notes (Signed)
[facility] EMERGENCY DEPARTMENT Provider Note   CSN: [id number] Arrival date & time: 08/19/20  1834     History Chief Complaint  Patient presents with  . Back Pain    Bryden Darden is a 55 y.o. male.  55 year old male presents with pain started after moving heavy furniture without specific injury.  Denies abdominal pain, loss of bowel or bladder control, groin numbness. Reports prior lumbar disc surgery, states occurred several years ago, does not still see the surgeon.  Patient has not taken anything for his pain.  No other complaints or concerns.        Past Medical History:  Diagnosis Date  . Back problem   . Chest pain    in hospital with stroke  . Cyst    left breast  . Depression   . GERD (gastroesophageal reflux disease)    occ  . Headache   . Sinus bradycardia on ECG    in hospital with stroke  . Stroke ([place]) 10/15   slight weakness in hands per patient    Patient Active Problem List   Diagnosis Date Noted  . Cervical post-laminectomy syndrome 06/02/2016  . Disc disease, degenerative, lumbar or lumbosacral 06/26/2015  . Adjustment disorder with mixed anxiety and depressed mood 09/11/2014  . Cervical spondylosis with radiculopathy 08/02/2014  . Cervical spondylosis without myelopathy 08/02/2014  . HLD (hyperlipidemia) 05/08/2014  . Headache disorder 05/08/2014  . Cerebrovascular accident, old 04/10/2014  . Bradycardia, sinus 03/19/2014  . Abnormal ECG 03/19/2014  . Carotid stenosis 03/18/2014  . H/O carotid endarterectomy 03/15/2014  . Temporary cerebral vascular dysfunction 03/13/2014  . Current tobacco use 09/28/2013  . Need for vaccination 09/28/2013  . Fatty tumor 09/28/2013  . Nerve root pain 09/28/2013  . DDD (degenerative disc disease), lumbosacral 09/28/2013  . Breast lipoma 09/28/2013  . Bradycardia 09/28/2013  . Chest pain   . Sinus bradycardia on ECG   . Cyst   . Back problem   . Back ache 01/12/2011  . NECK PAIN  07/26/2010  . NECK SPASM 07/26/2010    Past Surgical History:  Procedure Laterality Date  . ANTERIOR CERVICAL DECOMP/DISCECTOMY FUSION N/A 08/02/2014   Procedure: CERVICAL FIVE-SIX ANTERIOR CERVICAL DECOMPRESSION WITH FUSION INTERBODY PROSTHESIS PLATING AND BONEGRAFT.;  Surgeon: Newman Pies, MD;  Location: [facility];  Service: Neurosurgery;  Laterality: N/A;  . SHOULDER SURGERY     right shoulder arthroscopy       Family History  Problem Relation Age of Onset  . Hypertension Mother   . Diabetes Mother     Social History   Tobacco Use  . Smoking status: Current Every Day Smoker    Packs/day: 1.00    Years: 20.00    Pack years: 20.00    Types: Cigarettes  . Smokeless tobacco: Never Used  Substance Use Topics  . Alcohol use: Yes  . Drug use: Yes    Types: Marijuana    Home Medications Prior to Admission medications   Medication Sig Start Date End Date Taking? Authorizing Provider  meloxicam (MOBIC) 7.5 MG tablet Take 1 tablet (7.5 mg total) by mouth daily. 08/19/20  Yes Tacy Learn, PA-C  orphenadrine (NORFLEX) 100 MG tablet Take 1 tablet (100 mg total) by mouth 2 (two) times daily. 08/19/20  Yes Tacy Learn, PA-C  aspirin EC 81 MG tablet Take 81 mg by mouth daily.    [provider]  baclofen (LIORESAL) 10 MG tablet Take 1 tablet (10 mg total) by mouth  3 (three) times daily as needed for muscle spasms. 01/03/20   Molpus, Jenna, MD  divalproex (DEPAKOTE) 250 MG DR tablet Take 500 mg by mouth 2 (two) times daily.    [provider]  gabapentin (NEURONTIN) 300 MG capsule Take 1 capsule (300 mg total) by mouth 3 (three) times daily. 07/01/15   Newman Pies, MD  PARoxetine (PAXIL) 30 MG tablet Take 30 mg by mouth daily. 06/10/15   [provider]  rosuvastatin (CRESTOR) 20 MG tablet Take 20 mg by mouth daily.    [provider]    Allergies    Atorvastatin and Cephalexin  Review of Systems   Review of Systems  Constitutional:  Negative for fever.  Gastrointestinal: Negative for abdominal pain, constipation and diarrhea.  Genitourinary: Negative for decreased urine volume and difficulty urinating.  Musculoskeletal: Positive for back pain. Negative for gait problem.  Skin: Negative for rash and wound.  Allergic/Immunologic: Negative for immunocompromised state.  Neurological: Negative for weakness and numbness.  All other systems reviewed and are negative.   Physical Exam Updated Vital Signs BP 131/79 (BP Location: Right Arm)   Pulse 66   Temp 98.1 F (36.7 C) (Oral)   Resp 20   Ht 5\' 9"  (1.753 m)   Wt 77.1 kg   SpO2 98%   BMI 25.10 kg/m   Physical Exam Vitals and nursing note reviewed.  Constitutional:      General: He is not in acute distress.    Appearance: He is well-developed. He is not diaphoretic.  HENT:     Head: Normocephalic and atraumatic.  Cardiovascular:     Pulses: Normal pulses.  Pulmonary:     Effort: Pulmonary effort is normal.  Abdominal:     Palpations: Abdomen is soft.     Tenderness: There is no abdominal tenderness.  Musculoskeletal:        General: Tenderness present. No swelling or deformity.       Back:     Right lower leg: No edema.     Left lower leg: No edema.  Skin:    General: Skin is warm and dry.     Findings: No erythema or rash.  Neurological:     Mental Status: He is alert and oriented to person, place, and time.     Sensory: No sensory deficit.     Motor: No weakness.     Gait: Gait normal.     Deep Tendon Reflexes: Reflexes normal.  Psychiatric:        Behavior: Behavior normal.     ED Results / Procedures / Treatments   Labs (all labs ordered are listed, but only abnormal results are displayed) Labs Reviewed - No data to display  EKG None  Radiology No results found.  Procedures Procedures   Medications Ordered in ED Medications  ketorolac (TORADOL) 30 MG/ML injection 30 mg (has no administration in time range)  methylPREDNISolone  acetate (DEPO-MEDROL) injection 80 mg (has no administration in time range)  cyclobenzaprine (FLEXERIL) tablet 10 mg (has no administration in time range)    ED Course  I have reviewed the triage vital signs and the nursing notes.  Pertinent labs & imaging results that were available during my care of the patient were reviewed by me and considered in my medical decision making (see chart for details).  Clinical Course as of 08/19/20 Mee Hives Aug 19, 1736  5340 55 year old male with low back pain as above.  On exam found to have tenderness  across lower back without midline or bony tenderness.  Leg strength symmetric, DP pulses present, sensation intact.  Gait normal.  Patient was given Depo-Medrol, Flexeril and Toradol in the ER with prescription for meloxicam and Norflex.  Advised apply warm compresses and follow-up with his PCP. [LM]    Clinical Course User Index [LM] Roque Lias   MDM Rules/Calculators/A&P                          Final Clinical Impression(s) / ED Diagnoses Final diagnoses:  Strain of lumbar region, initial encounter    Rx / DC Orders ED Discharge Orders         Ordered    meloxicam (MOBIC) 7.5 MG tablet  Daily        08/19/20 1934    orphenadrine (NORFLEX) 100 MG tablet  2 times daily        08/19/20 1934           Roque Lias 08/19/20 1938    Truddie Hidden, MD 08/19/20 2158

## 2021-08-12 ENCOUNTER — Encounter: Payer: Self-pay | Admitting: Registered Nurse

## 2021-08-12 ENCOUNTER — Telehealth: Payer: Self-pay

## 2021-08-12 ENCOUNTER — Ambulatory Visit (INDEPENDENT_AMBULATORY_CARE_PROVIDER_SITE_OTHER): Payer: 59 | Admitting: Registered Nurse

## 2021-08-12 VITALS — BP 117/76 | HR 61 | Temp 98.0°F | Resp 18 | Ht 69.0 in | Wt 177.6 lb

## 2021-08-12 DIAGNOSIS — Z13220 Encounter for screening for lipoid disorders: Secondary | ICD-10-CM | POA: Diagnosis not present

## 2021-08-12 DIAGNOSIS — Z125 Encounter for screening for malignant neoplasm of prostate: Secondary | ICD-10-CM

## 2021-08-12 DIAGNOSIS — Z1159 Encounter for screening for other viral diseases: Secondary | ICD-10-CM

## 2021-08-12 DIAGNOSIS — K769 Liver disease, unspecified: Secondary | ICD-10-CM

## 2021-08-12 DIAGNOSIS — Z1211 Encounter for screening for malignant neoplasm of colon: Secondary | ICD-10-CM

## 2021-08-12 DIAGNOSIS — R1114 Bilious vomiting: Secondary | ICD-10-CM

## 2021-08-12 DIAGNOSIS — G8929 Other chronic pain: Secondary | ICD-10-CM

## 2021-08-12 DIAGNOSIS — F17210 Nicotine dependence, cigarettes, uncomplicated: Secondary | ICD-10-CM

## 2021-08-12 DIAGNOSIS — Z13228 Encounter for screening for other metabolic disorders: Secondary | ICD-10-CM

## 2021-08-12 DIAGNOSIS — Z130 Encounter for screening for diseases of the blood and blood-forming organs and certain disorders involving the immune mechanism: Secondary | ICD-10-CM | POA: Diagnosis not present

## 2021-08-12 DIAGNOSIS — Z1329 Encounter for screening for other suspected endocrine disorder: Secondary | ICD-10-CM

## 2021-08-12 DIAGNOSIS — M542 Cervicalgia: Secondary | ICD-10-CM

## 2021-08-12 LAB — COMPREHENSIVE METABOLIC PANEL
ALT: 21 U/L (ref 0–53)
AST: 18 U/L (ref 0–37)
Albumin: 4.2 g/dL (ref 3.5–5.2)
Alkaline Phosphatase: 72 U/L (ref 39–117)
BUN: 18 mg/dL (ref 6–23)
CO2: 25 mEq/L (ref 19–32)
Calcium: 9.5 mg/dL (ref 8.4–10.5)
Chloride: 106 mEq/L (ref 96–112)
Creatinine, Ser: 0.9 mg/dL (ref 0.40–1.50)
GFR: 97.4 mL/min (ref >60.00)
Glucose, Bld: 93 mg/dL (ref 70–99)
Potassium: 4.4 mEq/L (ref 3.5–5.1)
Sodium: 140 mEq/L (ref 135–145)
Total Bilirubin: 0.3 mg/dL (ref 0.2–1.2)
Total Protein: 6.1 g/dL (ref 6.0–8.3)

## 2021-08-12 LAB — CBC WITH DIFFERENTIAL/PLATELET
Basophils Absolute: 0.1 10*3/uL (ref 0.0–0.1)
Basophils Relative: 1.3 % (ref 0.0–3.0)
Eosinophils Absolute: 0.1 10*3/uL (ref 0.0–0.7)
Eosinophils Relative: 1.4 % (ref 0.0–5.0)
HCT: 48.6 % (ref 39.0–52.0)
Hemoglobin: 16.1 g/dL (ref 13.0–17.0)
Lymphocytes Relative: 25 % (ref 12.0–46.0)
Lymphs Abs: 2.5 10*3/uL (ref 0.7–4.0)
MCHC: 33.1 g/dL (ref 30.0–36.0)
MCV: 98.2 fl (ref 78.0–100.0)
Monocytes Absolute: 0.7 10*3/uL (ref 0.1–1.0)
Monocytes Relative: 6.8 % (ref 3.0–12.0)
Neutro Abs: 6.5 10*3/uL (ref 1.4–7.7)
Neutrophils Relative %: 65.5 % (ref 43.0–77.0)
Platelets: 239 10*3/uL (ref 150.0–400.0)
RBC: 4.96 Mil/uL (ref 4.22–5.81)
RDW: 14.1 % (ref 11.5–15.5)
WBC: 9.9 10*3/uL (ref 4.0–10.5)

## 2021-08-12 LAB — LIPID PANEL
Cholesterol: 183 mg/dL (ref 0–200)
HDL: 47.9 mg/dL (ref >39.00)
LDL Cholesterol: 96 mg/dL (ref 0–99)
NonHDL: 134.8
Total CHOL/HDL Ratio: 4
Triglycerides: 193 mg/dL — ABNORMAL HIGH (ref 0.0–149.0)
VLDL: 38.6 mg/dL (ref 0.0–40.0)

## 2021-08-12 LAB — PSA: PSA: 1.49 ng/mL (ref 0.10–4.00)

## 2021-08-12 LAB — TSH: TSH: 0.74 u[IU]/mL (ref 0.35–5.50)

## 2021-08-12 LAB — HEMOGLOBIN A1C: Hgb A1c MFr Bld: 6.1 % (ref 4.6–6.5)

## 2021-08-12 MED ORDER — BACLOFEN 10 MG PO TABS: 10.0000 mg | ORAL_TABLET | Freq: Three times a day (TID) | ORAL | 0 refills | Status: DC | PRN

## 2021-08-12 MED ORDER — MELOXICAM 15 MG PO TABS: 15.0000 mg | ORAL_TABLET | Freq: Every day | ORAL | 0 refills | Status: DC

## 2021-08-12 NOTE — Progress Notes (Signed)
? ?New Patient Office Visit ? ?Subjective:  ?Patient ID: Cody Knight, male    DOB: 1965-11-08  Age: 56 y.o. MRN: [id number] ? ?CC:  ?Chief Complaint  ?Patient presents with  ? New Patient (Initial Visit)  ?  Patient states he is here to establish care. Patient states he has been having some neck pain, nausea and been very fatigue for about 2 months.  ? ? ?HPI ?Cody Knight presents for visit to est care ? ?Concern for neck pain, nausea, fatigue. ? ?Neck pain ?S/p laminectomy in 2016. ?Pain on R side neck and up to head. Radiates towards shoulder. 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 Has been on NSAIDs, opioids, and muscle relaxers in the past with good effect. He would like to get back in with ortho for injections which have had best effect. ? ?Nausea, fatigue: ?Sometimes nausea r/t to fatigue ?Sometimes no nausea, but fatigued after 5-10 minutes of working  ?Denies lightheadedness, dizziness, loc. ?No shob, doe, chest pain, palpitations.  ? ?Past Medical History:  ?Diagnosis Date  ? Arthritis   ? Back problem   ? Chest pain   ? in hospital with stroke  ? Cyst   ? left breast  ? Depression   ? GERD (gastroesophageal reflux disease)   ? occ  ? Headache   ? Sinus bradycardia on ECG   ? in hospital with stroke  ? Stroke ([place]) 03/01/2014  ? slight weakness in hands per patient  ? Ulcer   ? ? ?Past Surgical History:  ?Procedure Laterality Date  ? ANTERIOR CERVICAL DECOMP/DISCECTOMY FUSION N/A 08/02/2014  ? Procedure: CERVICAL FIVE-SIX ANTERIOR CERVICAL DECOMPRESSION WITH FUSION INTERBODY PROSTHESIS PLATING AND BONEGRAFT.;  Surgeon: Newman Pies, MD;  Location: [facility];  Service: Neurosurgery;  Laterality: N/A;  ? BACK SURGERY    ? SHOULDER SURGERY    ? right shoulder arthroscopy  ? ? ?Family History  ?Problem Relation Age of Onset  ? Hypertension Mother   ? Diabetes Mother   ? Arthritis Mother   ? Cancer Sister   ? Breast cancer Sister   ? Liver cancer Sister   ? Leukemia Brother    ? Cancer Brother   ? COPD Brother   ? Diabetes Brother   ? ? ?Social History  ? ?Socioeconomic History  ? Marital status: Married  ?  Spouse name: Not on file  ? Number of children: 1  ? Years of education: Not on file  ? Highest education level: Not on file  ?Occupational History  ? Occupation: Architect  ?Tobacco Use  ? Smoking status: Every Day  ?  Packs/day: 1.00  ?  Years: 20.00  ?  Pack years: 20.00  ?  Types: Cigarettes  ? Smokeless tobacco: Never  ?Vaping Use  ? Vaping Use: Never used  ?Substance and Sexual Activity  ? Alcohol use: Yes  ? Drug use: Yes  ?  Types: Marijuana  ? Sexual activity: Yes  ?Other Topics Concern  ? Not on file  ?Social History Narrative  ? Not on file  ? ?Social Determinants of Health  ? ?Financial Resource Strain: Not on file  ?Food Insecurity: Not on file  ?Transportation Needs: Not on file  ?Physical Activity: Not on file  ?Stress: Not on file  ?Social Connections: Not on file  ?Intimate Partner Violence: Not on file  ? ? ?ROS ?Review of Systems  ?Constitutional: Negative.  Negative for activity change, appetite change, chills, fatigue and unexpected weight  change.  ?HENT: Negative.    ?Eyes: Negative.   ?Respiratory: Negative.    ?Cardiovascular: Negative.   ?Gastrointestinal:  Positive for nausea and vomiting. Negative for abdominal distention, abdominal pain, anal bleeding, blood in stool, constipation, diarrhea and rectal pain.  ?Endocrine: Negative.   ?Genitourinary: Negative.   ?Musculoskeletal:  Positive for neck pain and neck stiffness. Negative for arthralgias, back pain, gait problem, joint swelling and myalgias.  ?Skin: Negative.   ?Neurological: Negative.   ?Psychiatric/Behavioral: Negative.    ?All other systems reviewed and are negative. ? ?Objective:  ? ?Today's Vitals: BP 117/76   Pulse 61   Temp 98 ?F (36.7 ?C) (Temporal)   Resp 18   Ht '5\' 9"'$  (1.753 m)   Wt 177 lb 9.6 oz (80.6 kg)   SpO2 98%   BMI 26.23 kg/m?  ? ?Physical Exam ?Vitals and nursing note  reviewed.  ?Constitutional:   ?   Appearance: Normal appearance.  ?Cardiovascular:  ?   Rate and Rhythm: Normal rate and regular rhythm.  ?   Pulses: Normal pulses.  ?   Heart sounds: Normal heart sounds. No murmur heard. ?  No friction rub. No gallop.  ?Pulmonary:  ?   Effort: Pulmonary effort is normal. No respiratory distress.  ?   Breath sounds: No stridor. Wheezing (throughout, most notable upper left lobe) present. No rhonchi or rales.  ?Abdominal:  ?   General: Abdomen is flat. Bowel sounds are normal. There is no distension.  ?   Palpations: Abdomen is soft. There is no mass.  ?   Tenderness: There is no abdominal tenderness. There is no guarding or rebound.  ?   Hernia: No hernia is present.  ?Neurological:  ?   General: No focal deficit present.  ?   Mental Status: He is alert. Mental status is at baseline.  ?Psychiatric:     ?   Mood and Affect: Mood normal.     ?   Behavior: Behavior normal.     ?   Thought Content: Thought content normal.     ?   Judgment: Judgment normal.  ? ? ?Assessment & Plan:  ? ?Problem List Items Addressed This Visit   ?None ?Visit Diagnoses   ? ? Bilious vomiting with nausea    -  Primary  ? Relevant Orders  ? CT CHEST ABDOMEN PELVIS W CONTRAST  ? Liver lesion      ? Relevant Orders  ? CT CHEST ABDOMEN PELVIS W CONTRAST  ? Smokes less than 1 pack a day with greater than 30 pack year history      ? Relevant Orders  ? CT CHEST ABDOMEN PELVIS W CONTRAST  ? Chronic neck pain      ? Relevant Medications  ? meloxicam (MOBIC) 15 MG tablet  ? baclofen (LIORESAL) 10 MG tablet  ? Other Relevant Orders  ? Ambulatory referral to Orthopedic Surgery  ? Screen for colon cancer      ? Relevant Orders  ? Ambulatory referral to Gastroenterology  ? Screening for endocrine, metabolic and immunity disorder      ? Relevant Orders  ? CBC with Differential/Platelet  ? Comprehensive metabolic panel  ? Hemoglobin A1c  ? TSH  ? Lipid screening      ? Relevant Orders  ? Lipid panel  ? Need for hepatitis C  screening test      ? Relevant Orders  ? Hepatitis C antibody  ? Screening PSA (prostate specific antigen)      ?  Relevant Orders  ? PSA  ? ?  ? ? ?Outpatient Encounter Medications as of 08/12/2021  ?Medication Sig  ? meloxicam (MOBIC) 15 MG tablet Take 1 tablet (15 mg total) by mouth daily.  ? baclofen (LIORESAL) 10 MG tablet Take 1 tablet (10 mg total) by mouth 3 (three) times daily as needed for muscle spasms.  ? [DISCONTINUED] aspirin EC 81 MG tablet Take 81 mg by mouth daily.  ? [DISCONTINUED] baclofen (LIORESAL) 10 MG tablet Take 1 tablet (10 mg total) by mouth 3 (three) times daily as needed for muscle spasms.  ? [DISCONTINUED] divalproex (DEPAKOTE) 250 MG DR tablet Take 500 mg by mouth 2 (two) times daily.  ? [DISCONTINUED] gabapentin (NEURONTIN) 300 MG capsule Take 1 capsule (300 mg total) by mouth 3 (three) times daily.  ? [DISCONTINUED] meloxicam (MOBIC) 7.5 MG tablet Take 1 tablet (7.5 mg total) by mouth daily.  ? [DISCONTINUED] orphenadrine (NORFLEX) 100 MG tablet Take 1 tablet (100 mg total) by mouth 2 (two) times daily.  ? [DISCONTINUED] PARoxetine (PAXIL) 30 MG tablet Take 30 mg by mouth daily.  ? [DISCONTINUED] rosuvastatin (CRESTOR) 20 MG tablet Take 20 mg by mouth daily.  ? ?No facility-administered encounter medications on file as of 08/12/2021.  ? ? ?Follow-up: No follow-ups on file.  ? ?PLAN ?CT chest, abd, pelv for symptoms and to follow up on liver lesions ?Labs collected. Will follow up with the patient as warranted. ?Refer to Gi for colon ca screen. Fam hx of liver ca. Needs colonoscopy.  ?Meloxicam and baclofen for pain relief. Will refer to ortho for injections ?Patient encouraged to call clinic with any questions, comments, or concerns. ? ?Maximiano Coss, NP ? ?

## 2021-08-12 NOTE — Telephone Encounter (Signed)
Pt is requesting alternative to baclofen  ?

## 2021-08-12 NOTE — Telephone Encounter (Signed)
Caller name:Cody Knight  ? ?On DPR? :Yes ? ?Call back number:[phone] ? ?Provider they see: Delfino Lovett ? ?Reason for call:Pt said medication that was sent to the pharmacy for baclofen (LIORESAL) 10 MG tablet  he had that when he had a stroke and this is not working is there something else that can be called in?  ? ?

## 2021-08-12 NOTE — Patient Instructions (Signed)
Mr. Erekson -  ? ?Great to meet you ? ?Let's get labs and imaging done ? ?I have sent meloxicam and baclofen for pain relief. Referral to ortho placed. ? ?Will refer to GI for colonoscopy. ? ?Call if you need anything, see you in 6 mo for labs and physical ? ?Thanks, ? ?Rich  ?

## 2021-08-12 NOTE — Telephone Encounter (Signed)
Pt requesting an alternative to baclofen  ?

## 2021-08-13 LAB — HEPATITIS C ANTIBODY
Hepatitis C Ab: NONREACTIVE
SIGNAL TO CUT-OFF: <0.02 (ref <1.00)

## 2021-08-14 ENCOUNTER — Other Ambulatory Visit: Payer: Self-pay | Admitting: Registered Nurse

## 2021-08-14 MED ORDER — METHOCARBAMOL 500 MG PO TABS: 500.0000 mg | ORAL_TABLET | Freq: Three times a day (TID) | ORAL | 0 refills | Status: AC | PRN

## 2021-08-14 NOTE — Telephone Encounter (Signed)
Sent robaxin '500mg'$  po tid prn ? ?Thanks, ? ?Rich

## 2021-08-22 ENCOUNTER — Ambulatory Visit (INDEPENDENT_AMBULATORY_CARE_PROVIDER_SITE_OTHER): Payer: 59 | Admitting: Orthopaedic Surgery

## 2021-08-22 ENCOUNTER — Other Ambulatory Visit: Payer: Self-pay

## 2021-08-22 DIAGNOSIS — Z981 Arthrodesis status: Secondary | ICD-10-CM | POA: Diagnosis not present

## 2021-08-22 NOTE — Progress Notes (Signed)
? ?                            ? ? ?Chief Complaint: Neck pain ?  ? ? ?History of Present Illness:  ? ? ?Cody Knight is a 56 y.o. male presents with worsening neck pain of note he did have a previous what appears to be ACDF cervical fusion done in 2018 by Dr. Arnoldo Morale.  He is here today with ongoing neck pain that shoots up the back of the neck.  This has been severe enough that he has been needing oral narcotic pain medication.  He states that this is giving him migraine headaches.  He states that he has always had persistent pain after the procedure although this has been worse for the last 6 months.  This is limiting his ability to work in [employer]. ? ? ? ?Surgical History:   ?ACDF procedure at the C5-C6 level done in 2019 with Dr. Arnoldo Morale ? ?PMH/PSH/Family History/Social History/Meds/Allergies:   ? ?Past Medical History:  ?Diagnosis Date  ? Arthritis   ? Back problem   ? Chest pain   ? in hospital with stroke  ? Cyst   ? left breast  ? Depression   ? GERD (gastroesophageal reflux disease)   ? occ  ? Headache   ? Sinus bradycardia on ECG   ? in hospital with stroke  ? Stroke ([place]) 03/01/2014  ? slight weakness in hands per patient  ? Ulcer   ? ?Past Surgical History:  ?Procedure Laterality Date  ? ANTERIOR CERVICAL DECOMP/DISCECTOMY FUSION N/A 08/02/2014  ? Procedure: CERVICAL FIVE-SIX ANTERIOR CERVICAL DECOMPRESSION WITH FUSION INTERBODY PROSTHESIS PLATING AND BONEGRAFT.;  Surgeon: Newman Pies, MD;  Location: [place] [facility];  Service: Neurosurgery;  Laterality: N/A;  ? BACK SURGERY    ? SHOULDER SURGERY    ? right shoulder arthroscopy  ? ?Social History  ? ?Socioeconomic History  ? Marital status: Married  ?  Spouse name: Not on file  ? Number of children: 1  ? Years of education: Not on file  ? Highest education level: Not on file  ?Occupational History  ? Occupation: Architect  ?Tobacco Use  ? Smoking status: Every Day  ?  Packs/day: 1.00  ?  Years: 20.00  ?  Pack years: 20.00  ?  Types: Cigarettes   ? Smokeless tobacco: Never  ?Vaping Use  ? Vaping Use: Never used  ?Substance and Sexual Activity  ? Alcohol use: Yes  ? Drug use: Yes  ?  Types: Marijuana  ? Sexual activity: Yes  ?Other Topics Concern  ? Not on file  ?Social History Narrative  ? Not on file  ? ?Social Determinants of Health  ? ?Financial Resource Strain: Not on file  ?Food Insecurity: Not on file  ?Transportation Needs: Not on file  ?Physical Activity: Not on file  ?Stress: Not on file  ?Social Connections: Not on file  ? ?Family History  ?Problem Relation Age of Onset  ? Hypertension Mother   ? Diabetes Mother   ? Arthritis Mother   ? Cancer Sister   ? Breast cancer Sister   ? Liver cancer Sister   ? Leukemia Brother   ? Cancer Brother   ? COPD Brother   ? Diabetes Brother   ? ?Allergies  ?Allergen Reactions  ? Atorvastatin Other (See Comments)  ?  Myalgia  ? Cephalexin Nausea And Vomiting  ? ?Current Outpatient Medications  ?Medication Sig Dispense  Refill  ? meloxicam (MOBIC) 15 MG tablet Take 1 tablet (15 mg total) by mouth daily. 30 tablet 0  ? methocarbamol (ROBAXIN) 500 MG tablet Take 1 tablet (500 mg total) by mouth 3 (three) times daily as needed for muscle spasms. 60 tablet 0  ? ?No current facility-administered medications for this visit.  ? ?No results found. ? ?Review of Systems:   ?A ROS was performed including pertinent positives and negatives as documented in the HPI. ? ?Physical Exam :   ?Constitutional: NAD and appears stated age ?Neurological: Alert and oriented ?Psych: Appropriate affect and cooperative ?There were no vitals taken for this visit.  ? ?Comprehensive Musculoskeletal Exam:   ? ?Tenderness about the midline neck.  There is no radiation down the arms.  He has full equal strength in bilateral upper extremities.  Sensation normal in bilateral upper extremities.  2+ radial pulse negative Hoffmann bilaterally ? ?Imaging:   ? ? ?MRI (cervical spine): ?There is a previous nonfused ACDF at the level of C5-C6 ? ?I personally  reviewed and interpreted the radiographs. ? ? ?Assessment:   ?56 year old male who is several years status post an ACDF at the C5-C6 level by Dr. Arnoldo Morale.  At this point it appears as though his cervical fusion did not occur.  I described that this is likely the cause of his underlying pain.  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  At this time I think that he may ultimately be a candidate for discussion of revision surgery would like to refer him to a spinal surgery colleagues.  He is asking for narcotic pain medications which I described I do not typically place patients on for longer durations.  Plan for pain management referral for discussion of this. ? ?Plan :   ? ?-Plan for neurosurgical referral for discussion of possible revision surgery ?-Plan for pain management referral ? ? ? ? ?I personally saw and evaluated the patient, and participated in the management and treatment plan. ? ?Vanetta Mulders, MD ?Attending Physician, Orthopedic Surgery ? ?This document was dictated using Systems analyst. A reasonable attempt at proof reading has been made to minimize errors. ?

## 2021-08-25 ENCOUNTER — Telehealth: Payer: Self-pay

## 2021-08-25 NOTE — Telephone Encounter (Signed)
Patient called in stating that he seen the orthopedic, they discovered that a bracket in his neck broke lose and that it was causing the pain. Mars said the Orthopedic was supposed to reach out to [facility] to see if he would prescribe the patient some pain medication until he goes to see the surgeon. Emmerson said the pain is causing him to miss a lot of work and is just needing some relief.  ?

## 2021-08-26 ENCOUNTER — Other Ambulatory Visit: Payer: Self-pay

## 2021-08-26 ENCOUNTER — Ambulatory Visit (INDEPENDENT_AMBULATORY_CARE_PROVIDER_SITE_OTHER)
Admission: RE | Admit: 2021-08-26 | Discharge: 2021-08-26 | Disposition: A | Discharge status: Home / Self Care | Payer: 59 | Source: Ambulatory Visit | Attending: Registered Nurse | Admitting: Registered Nurse

## 2021-08-26 DIAGNOSIS — R911 Solitary pulmonary nodule: Secondary | ICD-10-CM

## 2021-08-26 DIAGNOSIS — F17210 Nicotine dependence, cigarettes, uncomplicated: Secondary | ICD-10-CM

## 2021-08-26 DIAGNOSIS — K769 Liver disease, unspecified: Secondary | ICD-10-CM

## 2021-08-26 DIAGNOSIS — R1114 Bilious vomiting: Secondary | ICD-10-CM

## 2021-08-26 MED ORDER — IOHEXOL 300 MG/ML  SOLN
100.0000 mL | Freq: Once | INTRAMUSCULAR | Status: AC | PRN
  Administered 2021-08-26: 100 mL via INTRAVENOUS

## 2021-08-26 NOTE — Telephone Encounter (Signed)
Pt called back about this states that the orthopedic provider was going to send a message to Richard to see if he could send in some pain medication for the pt.  ? ?Please advise he states that he is in a lot of pain  ?

## 2021-08-27 ENCOUNTER — Other Ambulatory Visit: Payer: Self-pay | Admitting: Registered Nurse

## 2021-08-27 ENCOUNTER — Encounter: Payer: Self-pay | Admitting: Registered Nurse

## 2021-08-27 DIAGNOSIS — G8929 Other chronic pain: Secondary | ICD-10-CM

## 2021-08-27 MED ORDER — TRAMADOL HCL 50 MG PO TABS
50.0000 mg | ORAL_TABLET | Freq: Three times a day (TID) | ORAL | 0 refills | Status: AC | PRN
Start: 2021-08-27 — End: 2021-09-01

## 2021-08-27 NOTE — Telephone Encounter (Signed)
Have sent tramadol ?Messaged pt about this ? ?Thanks, ? ?Rich

## 2021-08-27 NOTE — Telephone Encounter (Signed)
Please advise 

## 2021-08-28 ENCOUNTER — Encounter (HOSPITAL_BASED_OUTPATIENT_CLINIC_OR_DEPARTMENT_OTHER): Payer: Self-pay | Admitting: Orthopaedic Surgery

## 2021-08-29 ENCOUNTER — Other Ambulatory Visit: Payer: Self-pay | Admitting: Registered Nurse

## 2021-08-29 ENCOUNTER — Telehealth: Payer: Self-pay | Admitting: Registered Nurse

## 2021-08-29 DIAGNOSIS — G8929 Other chronic pain: Secondary | ICD-10-CM

## 2021-08-29 MED ORDER — OXYCODONE-ACETAMINOPHEN 5-325 MG PO TABS: 1.0000 | ORAL_TABLET | Freq: Three times a day (TID) | ORAL | 0 refills | Status: DC | PRN

## 2021-08-29 NOTE — Telephone Encounter (Signed)
He has been referred to Ortho and has seen Dr. Arnoldo Morale for this - if he can get in touch with them, that'd be great. I can send a few tabs of oxycodone-acetaminophen to help him through the weekend.  ? ?Thanks, ? ?Rich

## 2021-08-29 NOTE — Telephone Encounter (Signed)
Reviewed ortho notes - it looks like they were pointing him back to Dr. Arnoldo Morale as well. I suggest he see what Dr. Arnoldo Morale would say and we can follow from there. ? ?Thanks, ? ?Rich

## 2021-08-29 NOTE — Telephone Encounter (Signed)
Spoke to patient and he stated that he would like to be referred to someone else because he feels like they are not addressing his pain and they don't care. He is aware medication was sent in for the weekend. He also stated that he would still call Dr Arnoldo Morale on Monday until a new referral can be placed ?

## 2021-08-29 NOTE — Telephone Encounter (Signed)
Patient called stating that he is in a lot of pain. I did let him know what Richard advised and that the tramadol was sent in. He stated the tramadol does not help at all. States the pain is so bad he can hardly make it through the day and it makes him nauseous. I let him know that I can send another message to Richard to see what else he would advise. Patient stated someone would need to call him because his my chart isn't working.  ?

## 2021-09-02 ENCOUNTER — Other Ambulatory Visit: Payer: Self-pay | Admitting: Registered Nurse

## 2021-09-02 DIAGNOSIS — G8929 Other chronic pain: Secondary | ICD-10-CM

## 2021-09-02 MED ORDER — OXYCODONE-ACETAMINOPHEN 5-325 MG PO TABS: 1.0000 | ORAL_TABLET | Freq: Three times a day (TID) | ORAL | 0 refills | Status: AC | PRN

## 2021-09-02 NOTE — Telephone Encounter (Signed)
Pt called in stating that he is out of pain medications, he wanted to know if something else could be called in for him  ?

## 2021-09-02 NOTE — Telephone Encounter (Signed)
Patient is requesting a refill of the following medications: ?Requested Prescriptions  ? ?Pending Prescriptions Disp Refills  ? oxyCODONE-acetaminophen (PERCOCET/ROXICET) 5-325 MG tablet 15 tablet 0  ?  Sig: Take 1-2 tablets by mouth every 8 (eight) hours as needed for up to 5 days for severe pain.  ? ? ?Date of patient request: 09/02/2021 ?Last office visit: 08/12/2021 ?Date of last refill: 08/29/2021 ?Last refill amount: 15 tablets  ?Follow up time period per chart: 02/12/2022 ? ?

## 2021-09-08 ENCOUNTER — Telehealth: Payer: Self-pay

## 2021-09-08 ENCOUNTER — Encounter: Payer: Self-pay | Admitting: Physical Medicine & Rehabilitation

## 2021-09-08 NOTE — Telephone Encounter (Signed)
Patient called stating that he is having issues getting in with the other provider he was referred to. He is calling to see if Cody Knight can send in 15 more of the percocet. He is calling the doctor again today, he stated that if he can't get someone today he will call Dr Arnoldo Morale again. Please advise ?

## 2021-09-09 ENCOUNTER — Other Ambulatory Visit: Payer: Self-pay | Admitting: Registered Nurse

## 2021-09-09 DIAGNOSIS — G8929 Other chronic pain: Secondary | ICD-10-CM

## 2021-09-09 MED ORDER — OXYCODONE-ACETAMINOPHEN 10-325 MG PO TABS: 1.0000 | ORAL_TABLET | Freq: Four times a day (QID) | ORAL | 0 refills | Status: DC | PRN

## 2021-09-09 NOTE — Telephone Encounter (Signed)
Addressed this with you today, ? ?Thanks, ? ?Rich

## 2021-09-22 ENCOUNTER — Telehealth: Payer: Self-pay

## 2021-09-22 NOTE — Telephone Encounter (Signed)
If he can show [place] confirmation of his appointment in June, I can give him week-to-week refills on analgesics until that time ? ?Thank you, ? ?Rich

## 2021-09-22 NOTE — Telephone Encounter (Signed)
Patient is calling in stating that his neck pain has been causing him so much pain that he is having to leave work early. Unable to see surgeon until June. Asking for a call back,  ?

## 2021-09-23 ENCOUNTER — Other Ambulatory Visit: Payer: Self-pay | Admitting: Registered Nurse

## 2021-09-23 DIAGNOSIS — G8929 Other chronic pain: Secondary | ICD-10-CM

## 2021-09-23 MED ORDER — OXYCODONE-ACETAMINOPHEN 10-325 MG PO TABS: 1.0000 | ORAL_TABLET | Freq: Four times a day (QID) | ORAL | 0 refills | Status: DC | PRN

## 2021-09-23 NOTE — Telephone Encounter (Signed)
Patient is calling in stating he is in a lot of pain. This below shows his upcoming appointment in June with Dr.Kirsteins.  ? ? ?

## 2021-09-23 NOTE — Telephone Encounter (Signed)
Sent ? ?Thanks, ? ?Rich

## 2021-09-30 ENCOUNTER — Other Ambulatory Visit: Payer: Self-pay | Admitting: Registered Nurse

## 2021-10-01 ENCOUNTER — Telehealth: Payer: Self-pay | Admitting: Registered Nurse

## 2021-10-01 NOTE — Telephone Encounter (Signed)
PA for pt medication is in process.  ?

## 2021-10-01 NOTE — Telephone Encounter (Signed)
Patient is calling in stating he is in a lot of pain. This below shows his upcoming appointment in June with Dr.Kirsteins.  ? ? ?[facility]  ?

## 2021-10-03 ENCOUNTER — Other Ambulatory Visit: Payer: Self-pay | Admitting: Registered Nurse

## 2021-10-03 ENCOUNTER — Telehealth: Payer: Self-pay | Admitting: Registered Nurse

## 2021-10-03 DIAGNOSIS — G8929 Other chronic pain: Secondary | ICD-10-CM

## 2021-10-03 MED ORDER — OXYCODONE-ACETAMINOPHEN 10-325 MG PO TABS: 1.0000 | ORAL_TABLET | Freq: Four times a day (QID) | ORAL | 0 refills | Status: DC | PRN

## 2021-10-03 NOTE — Telephone Encounter (Signed)
Patient called stating that he called Wednesday and hasn't heard back from anyone about his oxy. Being refilled. I spoke to [employer] who confirmed that the PA was approved and I let the patient know the status and that he should be able to have that picked up. He stated that it is always for 15 days and not 30. Insurance will pay for 30 days he just needs it refilled for that instead of him having to pay for 15 days out of pocket. I reached out to Delfino Lovett letting him know that to which he responded he will have it done by at least 1:30 today. Patient is aware that I was reaching out to [facility] and would get the CMA to give him a call back ?

## 2021-10-03 NOTE — Telephone Encounter (Signed)
Message has been sent to richard morrow as reminder. ?

## 2021-10-03 NOTE — Telephone Encounter (Signed)
Sent Thanks Rich

## 2021-11-03 ENCOUNTER — Other Ambulatory Visit: Payer: Self-pay | Admitting: Registered Nurse

## 2021-11-03 DIAGNOSIS — G8929 Other chronic pain: Secondary | ICD-10-CM

## 2021-11-03 NOTE — Telephone Encounter (Signed)
Patient is requesting a refill of the following medications: Requested Prescriptions   Pending Prescriptions Disp Refills   oxyCODONE-acetaminophen (PERCOCET) 10-325 MG tablet 120 tablet 0    Sig: Take 1 tablet by mouth every 6 (six) hours as needed for pain.    Date of patient request: 11/03/2021 Last office visit: 08/12/2021 Date of last refill: 10/03/2021 Last refill amount: 120 tablets Follow up time period per chart: 02/12/2022

## 2021-11-03 NOTE — Telephone Encounter (Signed)
PT called stating he need his refill on his percocet 10-325 mg.

## 2021-11-04 MED ORDER — OXYCODONE-ACETAMINOPHEN 10-325 MG PO TABS: 1.0000 | ORAL_TABLET | Freq: Four times a day (QID) | ORAL | 0 refills | Status: AC | PRN

## 2021-11-11 ENCOUNTER — Encounter: Payer: 59 | Attending: Physical Medicine & Rehabilitation | Admitting: Physical Medicine & Rehabilitation

## 2021-11-11 ENCOUNTER — Encounter: Payer: Self-pay | Admitting: Physical Medicine & Rehabilitation

## 2021-11-11 VITALS — BP 134/81 | HR 61 | Temp 98.2°F | Ht 69.0 in | Wt 164.0 lb

## 2021-11-11 DIAGNOSIS — M961 Postlaminectomy syndrome, not elsewhere classified: Secondary | ICD-10-CM

## 2021-11-11 DIAGNOSIS — Z5181 Encounter for therapeutic drug level monitoring: Secondary | ICD-10-CM

## 2021-11-11 DIAGNOSIS — Z79899 Other long term (current) drug therapy: Secondary | ICD-10-CM | POA: Diagnosis present

## 2021-11-11 DIAGNOSIS — G894 Chronic pain syndrome: Secondary | ICD-10-CM | POA: Diagnosis present

## 2021-11-11 NOTE — Progress Notes (Deleted)
   Subjective:    Patient ID: Cody Knight, male    DOB: [birth year], 56 y.o.   MRN: [id number]  HPI  .cpr Review of Systems     Objective:   Physical Exam        Assessment & Plan:

## 2021-11-11 NOTE — Patient Instructions (Signed)
Need appt with Dr Arnoldo Morale- referral sent his office should call  Have Neck Xrays at [facility]. [address]

## 2021-11-11 NOTE — Progress Notes (Deleted)
   Subjective:    Patient ID: Cody Knight, male    DOB: 1966-03-27, 56 y.o.   MRN: [id number]  HPI   .cpr Review of Systems     Objective:   Physical Exam        Assessment & Plan:

## 2021-11-11 NOTE — Progress Notes (Signed)
Subjective:    Patient ID: Cody Knight, male    DOB: 1965-07-24, 56 y.o.   MRN: [id number]  HPI Chief complaint neck pain  56 year old male with history of neck pain for several years.  He did undergo C5-C6 ACDF by Dr. Earle Gell in 2016.  The patient feels like his pain has gotten worse over the last several months.  He states his pain averages 10 out of 10 described as stabbing interfering with sleep occurring all day all night worse with both activity and inactivity improves with medication.  He is able to climb steps he does not drive he is employed 30 hours a week. The patient has undergone evaluation by his primary care physician, new to the practice in March 2023, at that time the patient also had vomiting, and abdominal exam was negative he underwent CT chest abdomen pelvis and was referred to orthopedic surgery.  CBC c-Met lipid panel TSH unremarkable The CT revealed left lower lobe lung nodule with recommendations to repeat CT scan.  No GI abnormalities were noted other than possible liver hemangioma. The patient was seen by orthopedics, recommendations were for neurosurgical referral as well as pain management referral.  The patient arrived in clinic vomited several times He denies any abdominal pain, the patient states "the pain makes me vomit" Was prescribed oxycodone 10 mg 4 times daily 120 tablets on 11/04/2021 states that he still has some left.  But did not have a bottle available for pill count.  No bowel or bladder incontinence. Pain Inventory Average Pain 10 Pain Right Now 10 My pain is stabbing  In the last 24 hours, has pain interfered with the following? General activity 10 Relation with others 10 Enjoyment of life 10 What TIME of day is your pain at its worst? morning , daytime, evening, and night Sleep (in general) Poor  Pain is worse with: walking, bending, sitting, inactivity, standing, and some activites Pain improves with: medication Relief from Meds:  8  ability to climb steps?  yes do you drive?  no  employed # of hrs/week 30 hours per week  No problems in this area  Any changes since last visit?  no First visit  Primary care Maximiano Coss NP     Family History  Problem Relation Age of Onset   Hypertension Mother    Diabetes Mother    Arthritis Mother    Cancer Sister    Breast cancer Sister    Liver cancer Sister    Leukemia Brother    Cancer Brother    COPD Brother    Diabetes Brother    Social History   Socioeconomic History   Marital status: Married    Spouse name: Not on file   Number of children: 1   Years of education: Not on file   Highest education level: Not on file  Occupational History   Occupation: Architect  Tobacco Use   Smoking status: Every Day    Packs/day: 1.00    Years: 20.00    Total pack years: 20.00    Types: Cigarettes   Smokeless tobacco: Never  Vaping Use   Vaping Use: Never used  Substance and Sexual Activity   Alcohol use: Yes   Drug use: Yes    Types: Marijuana   Sexual activity: Yes  Other Topics Concern   Not on file  Social History Narrative   Not on file   Social Determinants of Health   Financial Resource Strain: Not on file  Food Insecurity: Not on file  Transportation Needs: Not on file  Physical Activity: Not on file  Stress: Not on file  Social Connections: Not on file   Past Surgical History:  Procedure Laterality Date   ANTERIOR CERVICAL DECOMP/DISCECTOMY FUSION N/A 08/02/2014   Procedure: CERVICAL FIVE-SIX ANTERIOR CERVICAL DECOMPRESSION WITH FUSION INTERBODY PROSTHESIS PLATING AND BONEGRAFT.;  Surgeon: Newman Pies, MD;  Location: [place] [facility];  Service: Neurosurgery;  Laterality: N/A;   BACK SURGERY     SHOULDER SURGERY     right shoulder arthroscopy   Past Medical History:  Diagnosis Date   Arthritis    Back problem    Chest pain    in hospital with stroke   Cyst    left breast   Depression    GERD (gastroesophageal reflux  disease)    occ   Headache    Sinus bradycardia on ECG    in hospital with stroke   Stroke ([place]) 03/01/2014   slight weakness in hands per patient   Ulcer    There were no vitals taken for this visit.  Opioid Risk Score:   Fall Risk Score:  `1  Depression screen [facility] 2/9     08/12/2021    8:54 AM 06/02/2016   11:37 AM  Depression screen PHQ 2/9  Decreased Interest 2 0  Down, Depressed, Hopeless 0 0  PHQ - 2 Score 2 0  Altered sleeping 3 2  Tired, decreased energy 3 2  Change in appetite 0 1  Feeling bad or failure about yourself  0 1  Trouble concentrating 0 1  Moving slowly or fidgety/restless 0 2  Suicidal thoughts 0 0  PHQ-9 Score 8 9  Difficult doing work/chores Somewhat difficult Very difficult     Review of Systems  Gastrointestinal:  Positive for nausea.  Musculoskeletal:  Positive for back pain and neck pain.  Neurological:  Positive for headaches.       Objective:   Physical Exam Vitals and nursing note reviewed.  Constitutional:      Appearance: He is normal weight. He is ill-appearing.  HENT:     Head: Normocephalic and atraumatic.     Mouth/Throat:     Mouth: Mucous membranes are moist.  Eyes:     Extraocular Movements: Extraocular movements intact.     Conjunctiva/sclera: Conjunctivae normal.     Pupils: Pupils are equal, round, and reactive to light.  Neck:     Comments: Cervical spine range of motion 75% of normal with flexion extension lateral bending and rotation negative foraminal compression test Cardiovascular:     Rate and Rhythm: Normal rate and regular rhythm.     Heart sounds: Normal heart sounds. No murmur heard. Pulmonary:     Effort: Pulmonary effort is normal. No respiratory distress.     Breath sounds: Normal breath sounds.  Abdominal:     General: Bowel sounds are normal. There is no distension.     Palpations: There is no mass.     Tenderness: There is no abdominal tenderness.  Musculoskeletal:     Cervical back: No  tenderness.  Skin:    General: Skin is warm and dry.  Neurological:     Mental Status: He is alert and oriented to person, place, and time.     Cranial Nerves: No facial asymmetry.     Sensory: Sensation is intact.     Motor: Motor function is intact.     Coordination: Coordination is intact.     Gait:  Gait is intact. Gait normal.     Deep Tendon Reflexes:     Reflex Scores:      Tricep reflexes are 2+ on the right side and 2+ on the left side.      Bicep reflexes are 2+ on the right side and 2+ on the left side.      Brachioradialis reflexes are 2+ on the right side and 2+ on the left side.      Patellar reflexes are 2+ on the right side and 2+ on the left side.      Achilles reflexes are 2+ on the right side and 2+ on the left side.    Comments: Motor strength is 5/5 bilateral deltoid, bicep, tricep, grip, hip flexor, knee extensor, ankle dorsiflexor and plantar flexor  Psychiatric:        Mood and Affect: Mood normal.        Behavior: Behavior normal.           Assessment & Plan:  1.  History of chronic neck pain with gradual worsening over the last several months.  Neurologic examination is normal which is reassuring.  No red flags.  Would recommend neurosurgical evaluation by Dr. Arnoldo Morale we will make referral. Will order cervical spine imaging, plain films to start.  Would defer to neurosurgery in terms of MRI versus cervical myelography if needed. In terms of narcotic analgesic medications we will check oral swab to check for illicit drugs as well as nonprescribed opiates. The patient has had a couple MD visits where he had vomiting but CT abdomen was negative.  May benefit from additional gastrointestinal work-up however opiate withdrawal is in the differential.  Would recommend that PCP has patient bring pill bottle into office for pill count this week.  The patient reports taking oxycodone today so his oral swab should demonstrate this.

## 2021-11-11 NOTE — Progress Notes (Deleted)
   Subjective:    Patient ID: Cody Knight, male    DOB: [birth year], 56 y.o.   MRN: [id number]  HPI   .cpr Review of Systems     Objective:   Physical Exam        Assessment & Plan:

## 2021-11-15 LAB — DRUG TOX MONITOR 1 W/CONF, ORAL FLD
Amphetamines: NEGATIVE ng/mL (ref <10)
Barbiturates: NEGATIVE ng/mL (ref <10)
Benzodiazepines: NEGATIVE ng/mL (ref <0.50)
Benzoylecgonine: 224 ng/mL — ABNORMAL HIGH (ref <5.0)
Buprenorphine: NEGATIVE ng/mL (ref <0.10)
Cocaine: 11.8 ng/mL — ABNORMAL HIGH (ref <5.0)
Cocaine: POSITIVE ng/mL — AB (ref <5.0)
Codeine: NEGATIVE ng/mL (ref <2.5)
Cotinine: >250 ng/mL — ABNORMAL HIGH (ref <5.0)
Dihydrocodeine: NEGATIVE ng/mL (ref <2.5)
Fentanyl: NEGATIVE ng/mL (ref <0.10)
Heroin Metabolite: NEGATIVE ng/mL (ref <1.0)
Hydrocodone: NEGATIVE ng/mL (ref <2.5)
Hydromorphone: NEGATIVE ng/mL (ref <2.5)
MARIJUANA: NEGATIVE ng/mL (ref <2.5)
MDMA: NEGATIVE ng/mL (ref <10)
Meprobamate: NEGATIVE ng/mL (ref <2.5)
Methadone: NEGATIVE ng/mL (ref <5.0)
Morphine: NEGATIVE ng/mL (ref <2.5)
Nicotine Metabolite: POSITIVE ng/mL — AB (ref <5.0)
Norhydrocodone: NEGATIVE ng/mL (ref <2.5)
Noroxycodone: 71.7 ng/mL — ABNORMAL HIGH (ref <2.5)
Opiates: POSITIVE ng/mL — AB (ref <2.5)
Oxycodone: >250 ng/mL — ABNORMAL HIGH (ref <2.5)
Oxymorphone: NEGATIVE ng/mL (ref <2.5)
Phencyclidine: NEGATIVE ng/mL (ref <10)
Tapentadol: NEGATIVE ng/mL (ref <5.0)
Tramadol: NEGATIVE ng/mL (ref <5.0)
Zolpidem: NEGATIVE ng/mL (ref <5.0)

## 2021-11-15 LAB — DRUG TOX ALC METAB W/CON, ORAL FLD: Alcohol Metabolite: NEGATIVE ng/mL (ref <25)

## 2021-11-17 ENCOUNTER — Telehealth: Payer: Self-pay | Admitting: *Deleted

## 2021-11-17 NOTE — Telephone Encounter (Signed)
MyChart message sent to Mr Mells informing him that Dr Letta Pate will not be able to prescribe narcotics for his pain.

## 2021-11-17 NOTE — Telephone Encounter (Signed)
Urine drug screen is positive for oxycodone as expected but is positive for Cocaine and its metabolite Benzoylecgonine as well.

## 2022-02-12 ENCOUNTER — Encounter: Payer: 59 | Admitting: Registered Nurse

## 2022-09-22 ENCOUNTER — Other Ambulatory Visit: Payer: Self-pay

## 2022-09-22 ENCOUNTER — Emergency Department (HOSPITAL_BASED_OUTPATIENT_CLINIC_OR_DEPARTMENT_OTHER)
Admission: EM | Admit: 2022-09-22 | Discharge: 2022-09-22 | Disposition: A | Discharge status: Home / Self Care | Payer: No Typology Code available for payment source | Attending: Emergency Medicine | Admitting: Emergency Medicine

## 2022-09-22 ENCOUNTER — Emergency Department (HOSPITAL_BASED_OUTPATIENT_CLINIC_OR_DEPARTMENT_OTHER): Payer: No Typology Code available for payment source

## 2022-09-22 ENCOUNTER — Encounter (HOSPITAL_BASED_OUTPATIENT_CLINIC_OR_DEPARTMENT_OTHER): Payer: Self-pay | Admitting: Urology

## 2022-09-22 DIAGNOSIS — Y92410 Unspecified street and highway as the place of occurrence of the external cause: Secondary | ICD-10-CM | POA: Diagnosis not present

## 2022-09-22 DIAGNOSIS — M546 Pain in thoracic spine: Secondary | ICD-10-CM | POA: Diagnosis not present

## 2022-09-22 DIAGNOSIS — Z7982 Long term (current) use of aspirin: Secondary | ICD-10-CM | POA: Diagnosis not present

## 2022-09-22 DIAGNOSIS — F17200 Nicotine dependence, unspecified, uncomplicated: Secondary | ICD-10-CM | POA: Insufficient documentation

## 2022-09-22 DIAGNOSIS — S199XXA Unspecified injury of neck, initial encounter: Secondary | ICD-10-CM | POA: Diagnosis present

## 2022-09-22 DIAGNOSIS — S161XXA Strain of muscle, fascia and tendon at neck level, initial encounter: Secondary | ICD-10-CM | POA: Diagnosis not present

## 2022-09-22 DIAGNOSIS — G8929 Other chronic pain: Secondary | ICD-10-CM

## 2022-09-22 MED ORDER — BACLOFEN 10 MG PO TABS: 10.0000 mg | ORAL_TABLET | Freq: Three times a day (TID) | ORAL | 0 refills | Status: AC

## 2022-09-22 MED ORDER — OXYCODONE-ACETAMINOPHEN 5-325 MG PO TABS
2.0000 | ORAL_TABLET | Freq: Once | ORAL | Status: AC
  Administered 2022-09-22: 2 via ORAL
  Filled 2022-09-22: qty 2

## 2022-09-22 MED ORDER — MELOXICAM 15 MG PO TABS
15.0000 mg | ORAL_TABLET | Freq: Every day | ORAL | 0 refills | Status: AC
Start: 2022-09-22 — End: ?
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
## 2022-09-22 NOTE — Discharge Instructions (Addendum)
Thank you for allowing me to be a part of your care today.   Your imaging was negative for acute injury to your spine.  I have sent over a muscle relaxant and anti-inflammatory to your pharmacy to help treat your back pain.   Please schedule a follow-up appointment with your primary care provider.   Return to the ED if you experience sudden worsening of your symptoms or if you have any new concerns.
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
## 2022-10-31 IMAGING — CT CT CHEST-ABD-PELV W/ CM
2 of 5 series · 13 of 46 positions shown, 15 images · IV contrast (OMNIPAQUE 300)
Comparison: 01/03/2020

CLINICAL DATA: Nausea and abdominal pain, history of tobacco use,
possible hepatic lesions

EXAM:
CT CHEST, ABDOMEN, AND PELVIS WITH CONTRAST
TECHNIQUE: Multidetector CT imaging of the chest, abdomen and pelvis was
performed following the standard protocol during bolus
administration of intravenous contrast.

[Series 2: cap with · axial · 0.65mm/px · z∈[-616,-76]mm · 10 of 133 slices shown, 12 images]
[im 13/133  soft-tissue]
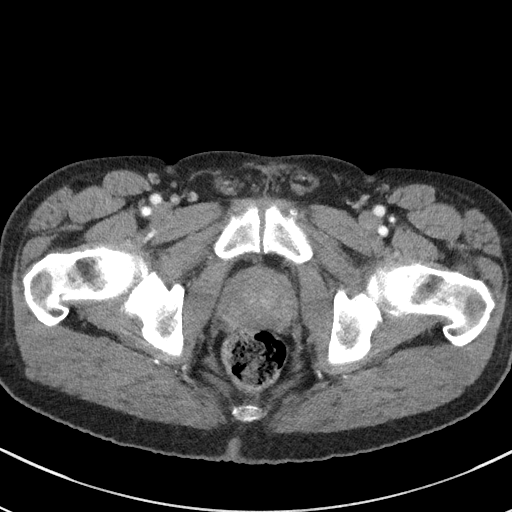
[im 13/133  bone]
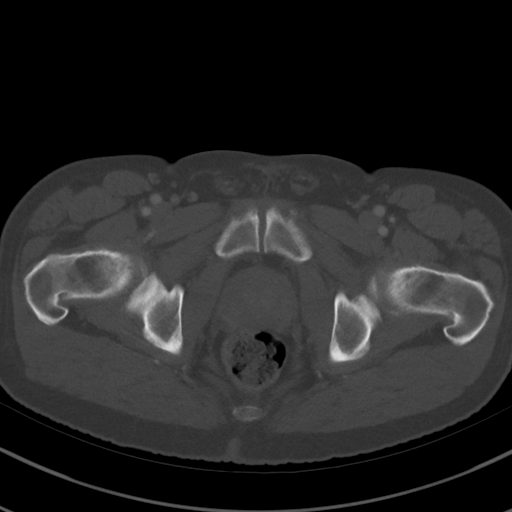
[im 25/133  soft-tissue]
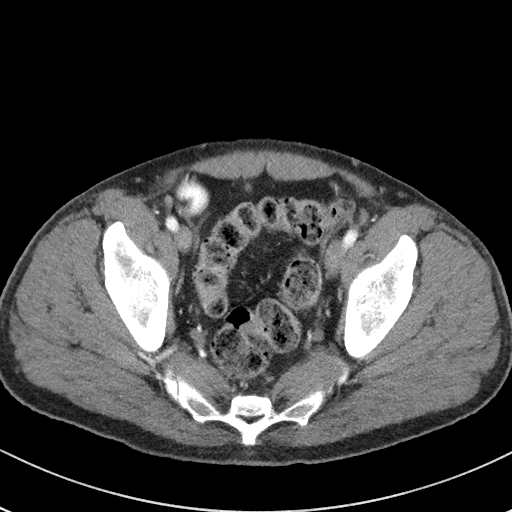
[im 37/133  soft-tissue]
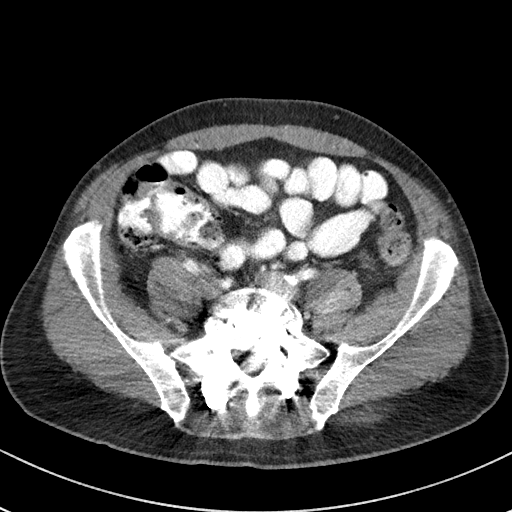
[im 49/133  soft-tissue]
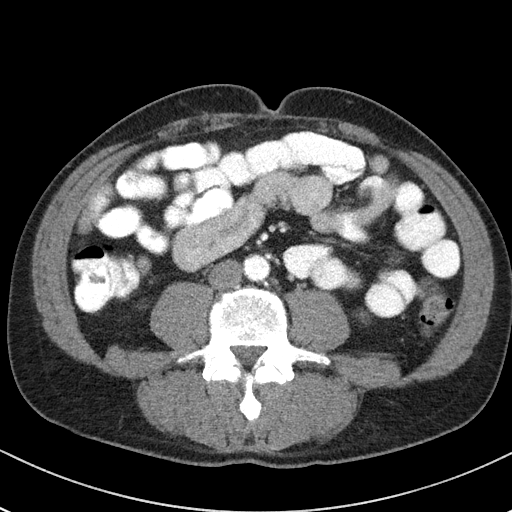
[im 61/133  soft-tissue]
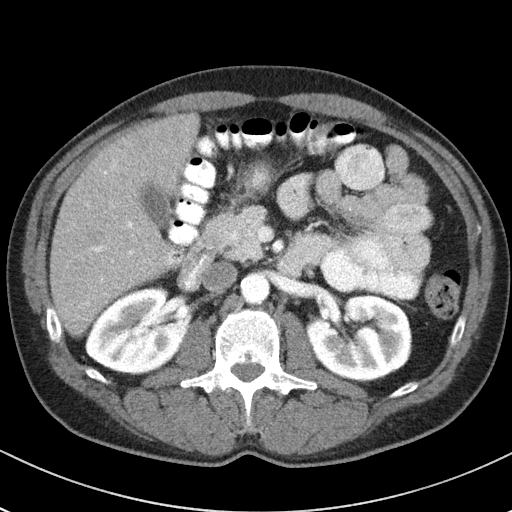
[im 73/133  soft-tissue]
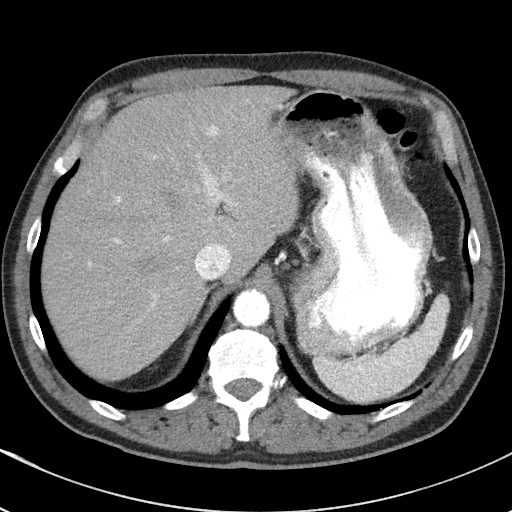
[im 85/133  soft-tissue]
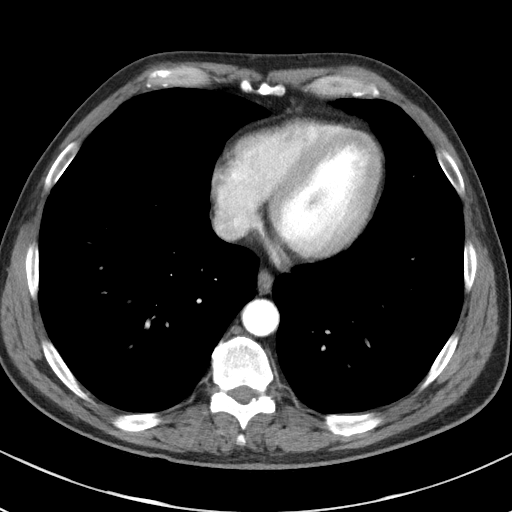
[im 97/133  soft-tissue]
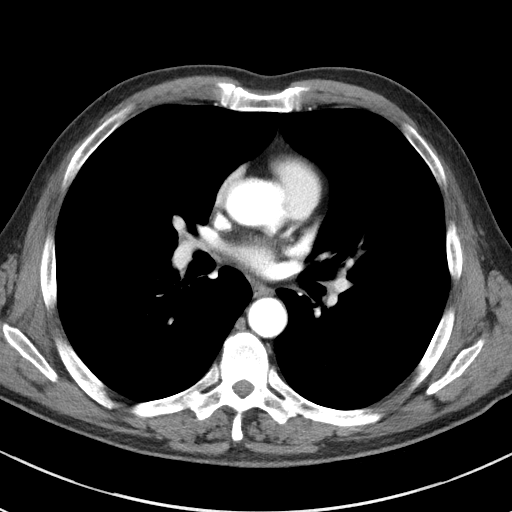
[im 109/133  soft-tissue]
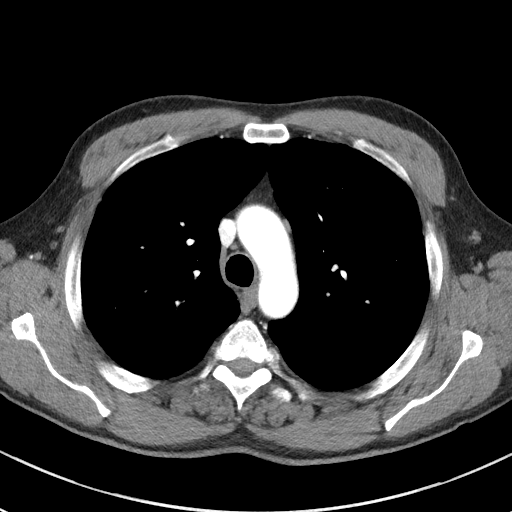
[im 109/133  bone]
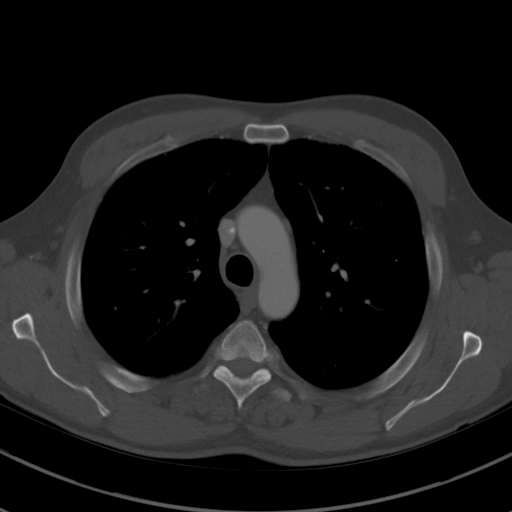
[im 121/133  soft-tissue]
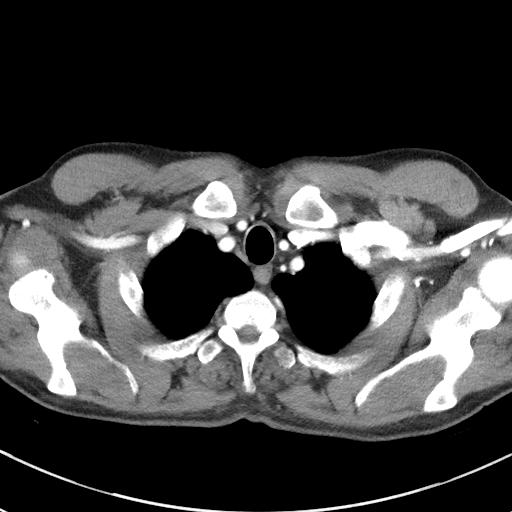

[Series 5: coronal · coronal · 0.76mm/px · 3 of 132 slices shown]
[im 44/132  soft-tissue]
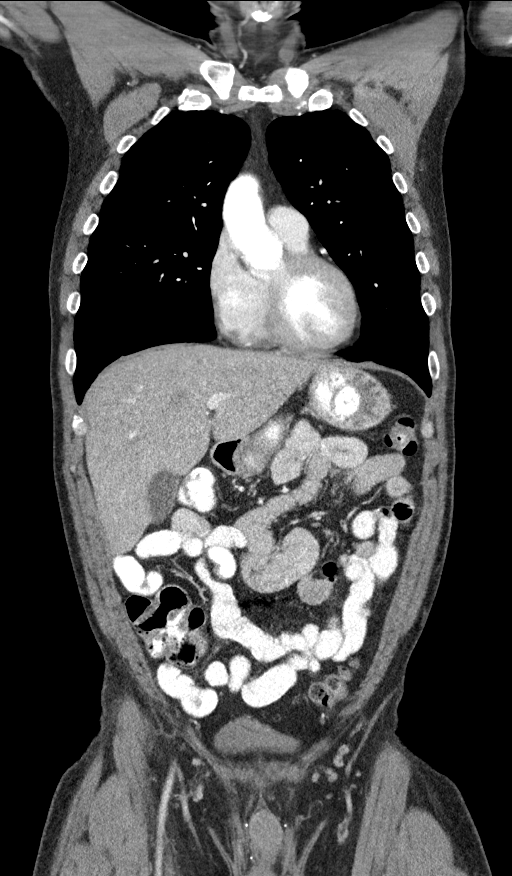
[im 59/132  soft-tissue]
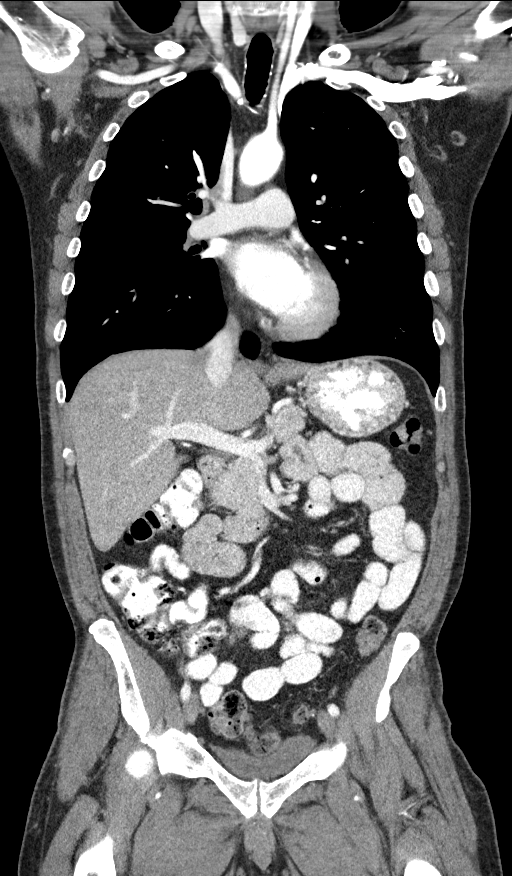
[im 73/132  soft-tissue]
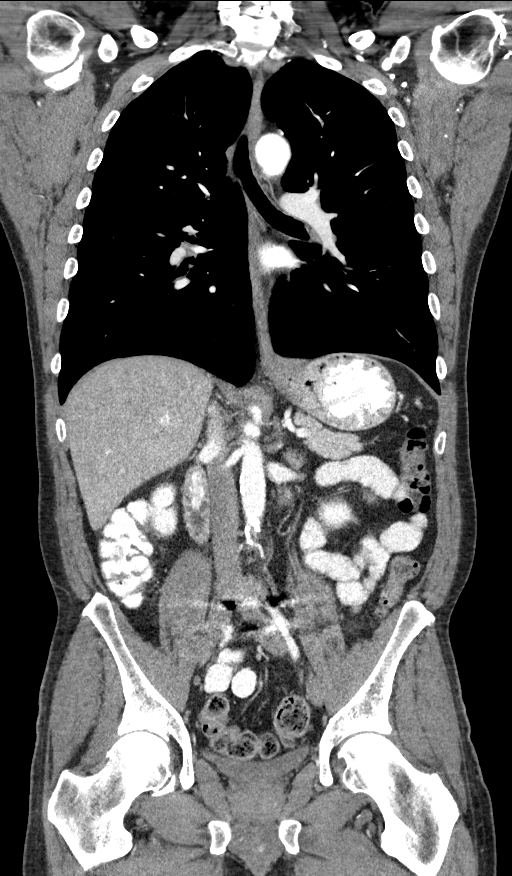

[13 of 46 positions shown; findings below may reference images not displayed]

RADIATION DOSE REDUCTION: This exam was performed according to the
departmental dose-optimization program which includes automated
exposure control, adjustment of the mA and/or kV according to
patient size and/or use of iterative reconstruction technique.

CONTRAST:  100mL OMNIPAQUE IOHEXOL 300 MG/ML  SOLN
FINDINGS: CT CHEST FINDINGS

Cardiovascular: Atherosclerotic calcifications are noted. No
aneurysmal dilatation or dissection is noted. No cardiac enlargement
is seen. No coronary calcifications are noted. The pulmonary artery
as visualized is within normal limits.

Mediastinum/Nodes: Thoracic inlet is within normal limits. No
sizable hilar or mediastinal adenopathy is noted. The esophagus is
normal limits.

Lungs/Pleura: Lungs are well aerated bilaterally. No focal
infiltrate or sizable effusion are noted. There is a rounded
somewhat hypodense lesion identified within the left lower lobe seen
on image 111 of series 3. this measures approximately 11 mm in
greatest dimension. No other parenchymal nodules are seen.

Musculoskeletal: Mild degenerative changes of the thoracic spine are
seen. No acute rib abnormality is noted. Old rib fractures on the
left are seen.

CT ABDOMEN PELVIS FINDINGS

Hepatobiliary: Vague hypodensity is noted with some peripheral
enhancement which is less well visualized on the delayed images most
consistent with a hemangioma. This corresponds to an area of
decreased attenuation seen on prior CT. The gallbladder is within
normal limits. No other discrete lesion is noted.

Pancreas: Unremarkable. No pancreatic ductal dilatation or
surrounding inflammatory changes.

Spleen: Normal in size without focal abnormality.

Adrenals/Urinary Tract: Adrenal glands are within normal limits.
Kidneys are well visualized and within normal limits. No renal
calculi or obstructive changes are seen. Normal excretion is noted
on delayed images. The ureters are within normal limits. The bladder
is decompressed.

Stomach/Bowel: No obstructive or inflammatory changes of the colon
are seen. Appendix is not well visualized. No inflammatory changes
suggest appendicitis are noted. The stomach and small bowel are
unremarkable.

Vascular/Lymphatic: Aortic atherosclerosis. No enlarged abdominal or
pelvic lymph nodes.

Reproductive: Prostate is unremarkable.

Other: No abdominal wall hernia or abnormality. No abdominopelvic
ascites.

Musculoskeletal: Postsurgical changes of the lower lumbar spine are
noted.
IMPRESSION: CT of the chest:

11 mm left lower lobe solid pulmonary nodule. Consider a
non-contrast Chest CT at 3 months, a PET/CT, or tissue sampling.

These guidelines do not apply to immunocompromised patients and
patients with cancer. Follow up in patients with significant
comorbidities as clinically warranted. For lung cancer screening,
adhere to Lung-RADS guidelines. Reference: Radiology. 9296;
284(1):228-43.

No other focal abnormality is noted.

CT of the abdomen and pelvis: Hypodensity with mild peripheral
enhancement in the right lobe of the liver which is less well
visualized on delayed images most consistent with hemangioma. Other
focal mass is noted.

No other focal abnormality is noted.

## 2022-12-21 ENCOUNTER — Ambulatory Visit (INDEPENDENT_AMBULATORY_CARE_PROVIDER_SITE_OTHER): Payer: Self-pay | Admitting: Sports Medicine

## 2022-12-21 ENCOUNTER — Encounter: Payer: Self-pay | Admitting: Sports Medicine

## 2022-12-21 ENCOUNTER — Other Ambulatory Visit: Payer: Self-pay

## 2022-12-21 DIAGNOSIS — Z981 Arthrodesis status: Secondary | ICD-10-CM

## 2022-12-21 DIAGNOSIS — M542 Cervicalgia: Secondary | ICD-10-CM

## 2022-12-21 DIAGNOSIS — G8929 Other chronic pain: Secondary | ICD-10-CM

## 2022-12-21 NOTE — Progress Notes (Addendum)
Office Visit Note   Patient: Cody Knight           Date of Birth: 1965-11-22           MRN: [id number] Visit Date: 12/21/2022              Requested by: Janeece Agee, NP [address] [place],  [place] [address] PCP: Janeece Agee, NP   Assessment & Plan: Visit Diagnoses:  1. Status post cervical arthrodesis     Plan: Discussed with patient the best plan regarding his cervical pain.  Patient's pain is most likely related to his fusion at C5/C6.  Patient's x-rays and CT were independently reviewed and given patient's physical exam as well as complaints, patient's pain is most likely related to the cervical area.  Patient would benefit from surgical evaluation at this time.  Patient does have a standing referral for a surgeon who did his surgery, patient states that he has been unable to get in touch with the surgeon.  Advised patient that he can continue trying to reach out to the surgeon this week and if there is no improvement then we can send him to the spine surgeon here [facility]. Patient was also advised to try to follow-up with pain management. Patient is agreeable with that plan.   Follow-Up Instructions: No follow-ups on file.   Orders:  Orders Placed This Encounter  Procedures   XR Cervical Spine 2 or 3 views   No orders of the defined types were placed in this encounter.     Procedures: No procedures performed   Clinical Data: No additional findings.   Subjective: Chief Complaint  Patient presents with   Neck - Pain    Patient is presenting today with complaints of Low blood pressure and persistent headaches. Patient states he has been dealing with headaches ever since a surgery for disk fusion.  Patient had a ACDF done at C5-C6 in 2016. Patient states has been dealing with the pain since then.  Patient had subsequent follow-up with his neurosurgeon at that time and patient states that the neurosurgeon told him that his pain will slowly improve.  Patient  was also seen by pain management however patient had failed outpatient drug tests and no longer was able to follow-up with pain management given that he had a pain management contract.Patient states that sometimes his pain is on the he has nausea and vomiting.  Patient states that the pain is primary located in his neck and travels up to the posterior side of his head.  Patient denies any weakness, numbness or tingling of the lower extremities or upper extremities. Patient states that he has tried meloxicam, baclofen as well as opioids.  Patient denies any relief with meloxicam or baclofen. Chart review of external notes do show that the patient has had ESI's of the affected area.  Patient was advised to continue following up with pain management however patient's urine tox screens are often positive for cocaine.    Review of Systems   Objective: Vital Signs: There were no vitals taken for this visit.   Cervical spine  - Inspection: no gross deformity or asymmetry, swelling or ecchymosis. No skin changes.  - Palpation: There is tenderness to palpation over the spinous processes, more so noted at C4, C5, C6.  No paraspinal tenderness noted.  - ROM: full active ROM of the cervical spine in flex/ext/SB/rotation without pain    - Strength: 5/5 strength of upper extremity in C5-T1 nerve  root distributions b/l  *C5: Shoulder abduction  *C6: Elbow flexion, Wrist extension  *C7: Elbow extension  *C8: Thumb extension, Wrist Ulnar deviation  *T1: Finger abduction    - Neuro: sensation intact in the C5-T1 nerve root distribution b/l; Tricep/Bicep DTR's are appropriate  Specialty Comments:  No specialty comments available.  Imaging: XR Cervical Spine 2 or 3 views  Result Date: 12/21/2022 Cervical spine AP and lateral 2 view x-ray. X-ray shows cervical fusion at C5/C6.  There is some osteophytic change noted at C5/C6 as well.  There is mild C6 over C7 anterograde listhesis.  There is well-preserved  body heights of the vertebra.   CT cervical without contrast 09/22/2022 also reviewed.  Noted cervical fusion at C5-C6.  Hardware intact.  Appropriate vertebral height noted.  PMFS History: Patient Active Problem List   Diagnosis Date Noted   Cervical post-laminectomy syndrome 06/02/2016   Disc disease, degenerative, lumbar or lumbosacral 06/26/2015   Adjustment disorder with mixed anxiety and depressed mood 09/11/2014   Cervical spondylosis with radiculopathy 08/02/2014   Cervical spondylosis without myelopathy 08/02/2014   HLD (hyperlipidemia) 05/08/2014   Headache disorder 05/08/2014   Cerebrovascular accident, old 04/10/2014   Bradycardia, sinus 03/19/2014   Abnormal ECG 03/19/2014   Carotid stenosis 03/18/2014   H/O carotid endarterectomy 03/15/2014   Temporary cerebral vascular dysfunction 03/13/2014   Current tobacco use 09/28/2013   Need for vaccination 09/28/2013   Fatty tumor 09/28/2013   Nerve root pain 09/28/2013   DDD (degenerative disc disease), lumbosacral 09/28/2013   Breast lipoma 09/28/2013   Bradycardia 09/28/2013   Chest pain    Sinus bradycardia on ECG    Cyst    Back problem    Back ache 01/12/2011   NECK PAIN 07/26/2010   NECK SPASM 07/26/2010   Past Medical History:  Diagnosis Date   Arthritis    Back problem    Chest pain    in hospital with stroke   Cyst    left breast   Depression    GERD (gastroesophageal reflux disease)    occ   Headache    Sinus bradycardia on ECG    in hospital with stroke   Stroke (HCC) 03/01/2014   slight weakness in hands per patient   Ulcer     Family History  Problem Relation Age of Onset   Hypertension Mother    Diabetes Mother    Arthritis Mother    Cancer Sister    Breast cancer Sister    Liver cancer Sister    Leukemia Brother    Cancer Brother    COPD Brother    Diabetes Brother     Past Surgical History:  Procedure Laterality Date   ANTERIOR CERVICAL DECOMP/DISCECTOMY FUSION N/A 08/02/2014    Procedure: CERVICAL FIVE-SIX ANTERIOR CERVICAL DECOMPRESSION WITH FUSION INTERBODY PROSTHESIS PLATING AND BONEGRAFT.;  Surgeon: Tressie Stalker, MD;  Location: [facility];  Service: Neurosurgery;  Laterality: N/A;   BACK SURGERY     SHOULDER SURGERY     right shoulder arthroscopy   Social History   Occupational History   Occupation: Holiday representative  Tobacco Use   Smoking status: Every Day    Current packs/day: 1.00    Average packs/day: 1 pack/day for 20.0 years (20.0 ttl pk-yrs)    Types: Cigarettes   Smokeless tobacco: Never  Vaping Use   Vaping status: Never Used  Substance and Sexual Activity   Alcohol use: Yes    Comment: occ   Drug use: Yes  Types: Marijuana   Sexual activity: Yes

## 2022-12-21 NOTE — Progress Notes (Signed)
Neck pain going on for several months States the pain is so severe that it makes him nauseated   States he was in an accident months ago that re aggravated his neck   5-6 years ago; he had a stroke which resulted in a cervical surgery to remove a disc  No radicular pain Pain only goes into top of his shoulders Has been seeing Dr. Hollice Espy for this; and has had about 10 treatments with him; minimal to no relief  Denies any medication for pain
